# Patient Record
Sex: Male | Born: 1965 | Race: White | Hispanic: No | Marital: Single | Smoking: Never smoker
Health system: Southern US, Community
[De-identification: ages and names within clinical notes are randomized; demographics above are authoritative.]

## PROBLEM LIST (undated history)

## (undated) DIAGNOSIS — D352 Benign neoplasm of pituitary gland: Secondary | ICD-10-CM

## (undated) DIAGNOSIS — I10 Essential (primary) hypertension: Secondary | ICD-10-CM

## (undated) DIAGNOSIS — M109 Gout, unspecified: Secondary | ICD-10-CM

## (undated) HISTORY — DX: Benign neoplasm of pituitary gland: D35.2

## (undated) HISTORY — DX: Gout, unspecified: M10.9

## (undated) HISTORY — DX: Essential (primary) hypertension: I10

---

## 2005-12-21 ENCOUNTER — Ambulatory Visit (HOSPITAL_COMMUNITY): Admission: RE | Admit: 2005-12-21 | Discharge: 2005-12-21 | Payer: Self-pay | Admitting: Unknown Physician Specialty

## 2006-01-13 ENCOUNTER — Other Ambulatory Visit: Payer: Self-pay

## 2006-01-13 ENCOUNTER — Emergency Department: Payer: Self-pay | Admitting: Emergency Medicine

## 2006-06-28 ENCOUNTER — Emergency Department: Payer: Self-pay | Admitting: Unknown Physician Specialty

## 2006-06-28 ENCOUNTER — Other Ambulatory Visit: Payer: Self-pay

## 2007-04-13 IMAGING — CR DG ABDOMEN 3V
1 series · 6 of 6 positions shown · non-contrast
Comparison: none

REASON FOR EXAM: Abdominal pain. [HOSPITAL]
COMMENTS:

[Series 1: view not recorded · 0.17mm/px · 6 of 6 slices shown]
[im 1/6]
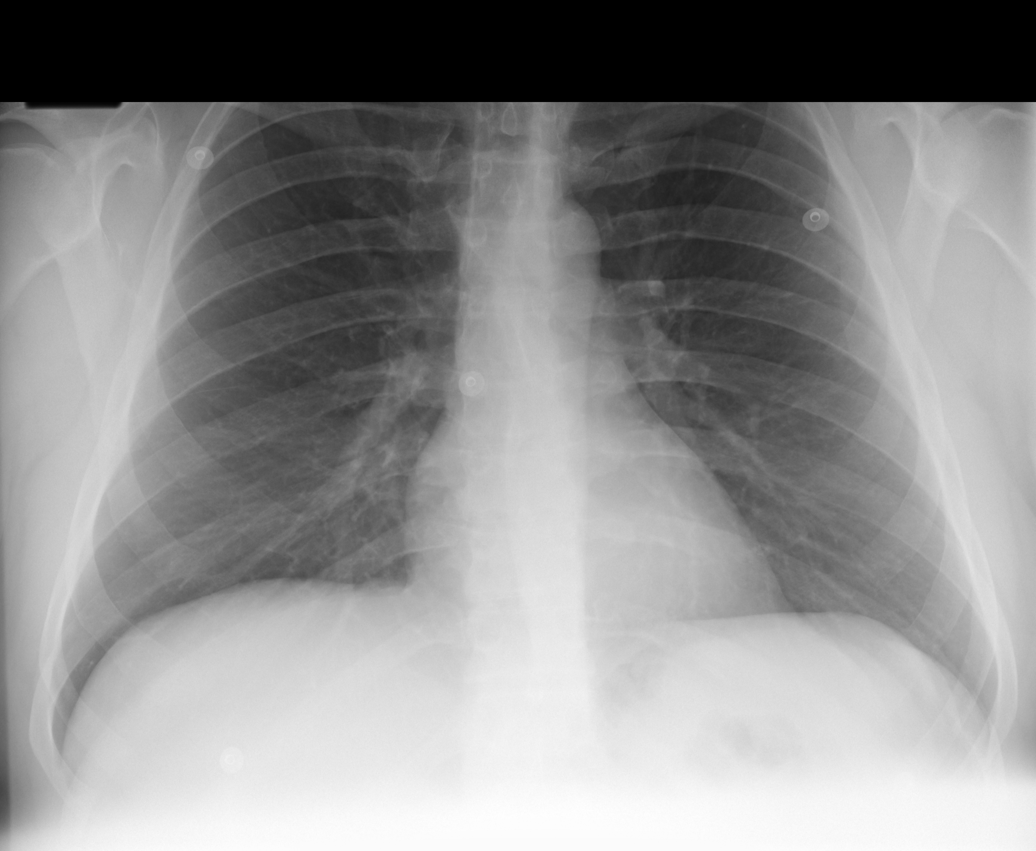
[im 2/6]
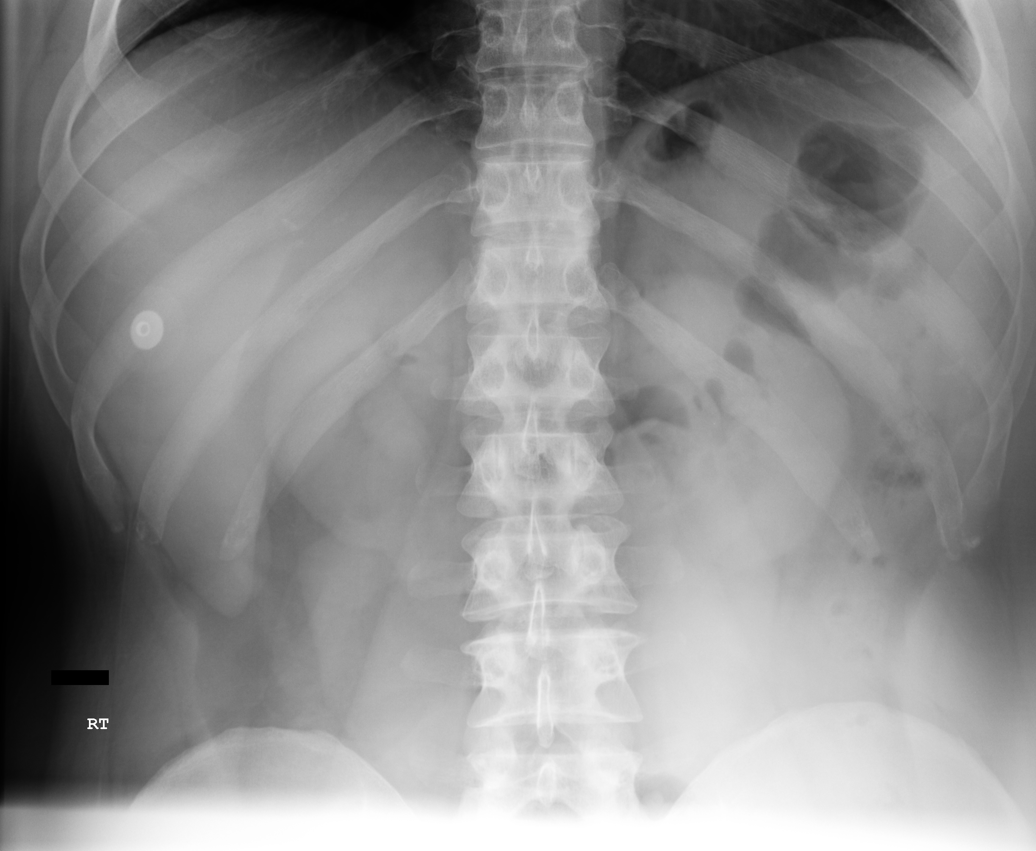
[im 3/6]
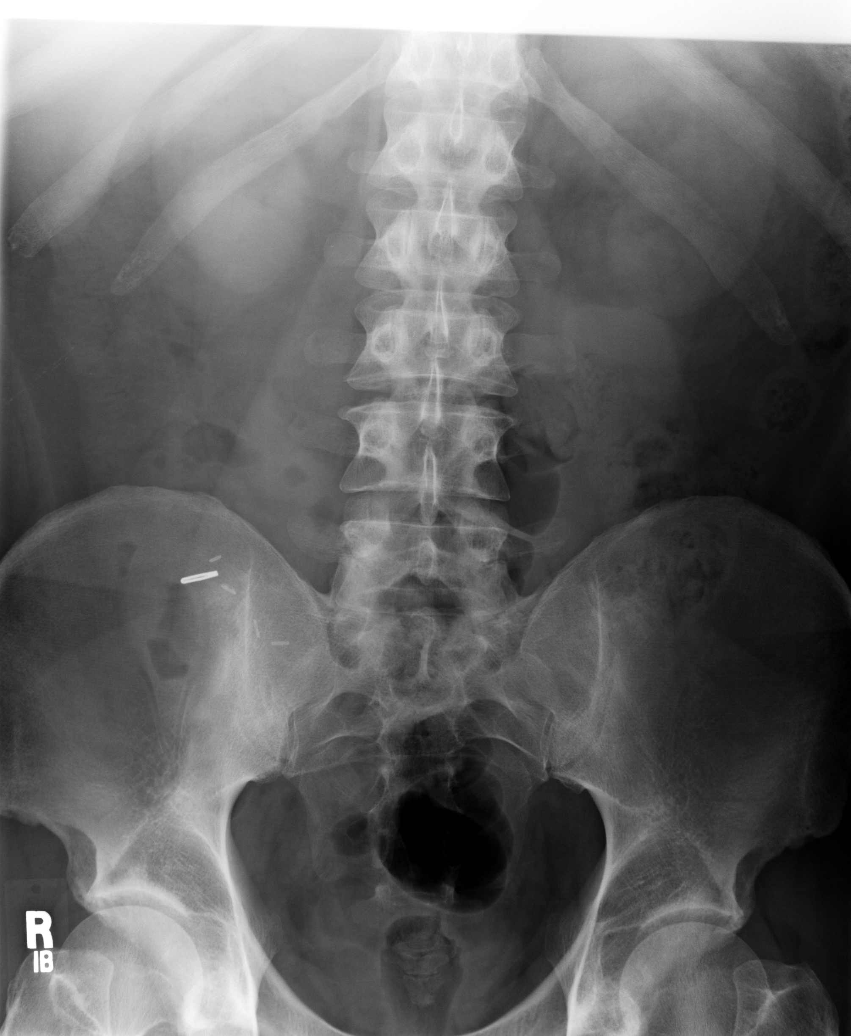
[im 4/6]
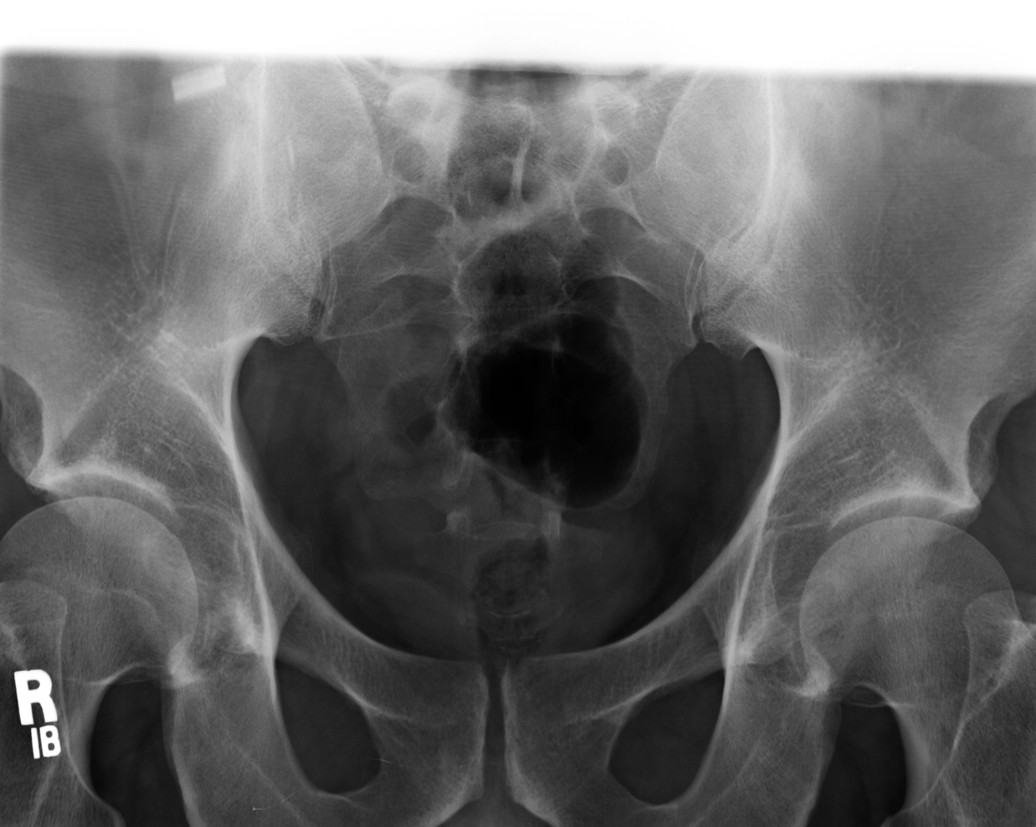
[im 5/6]
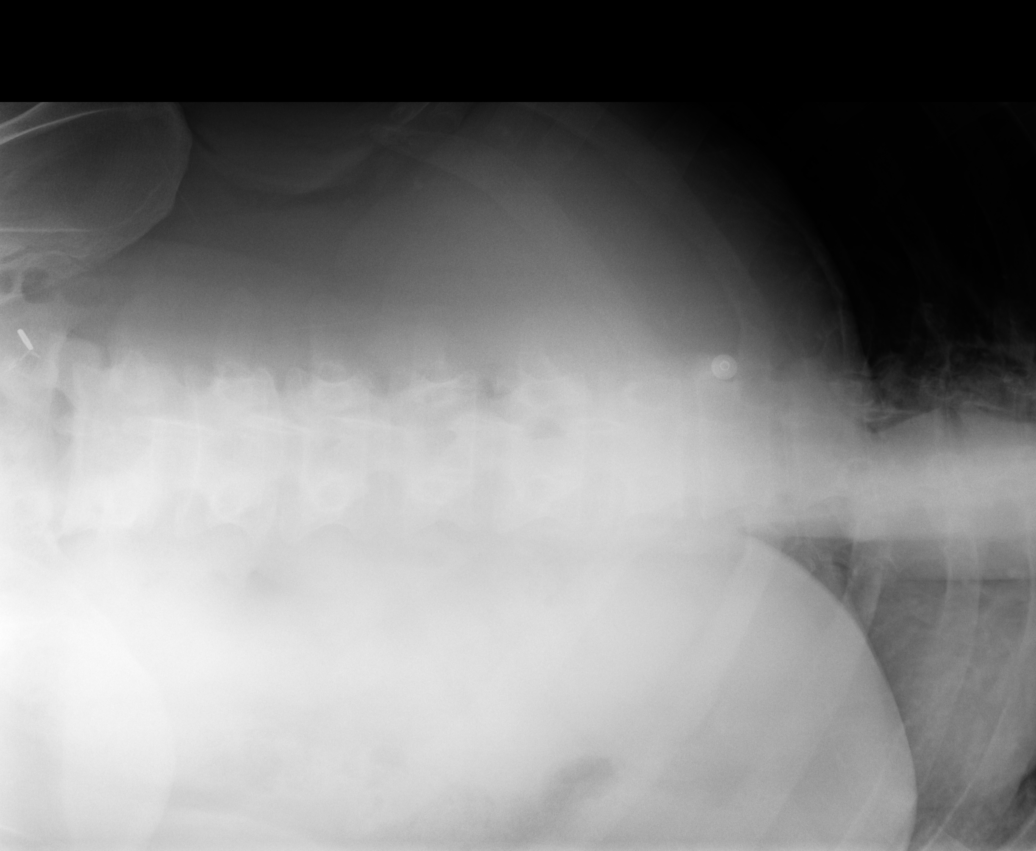
[im 6/6]
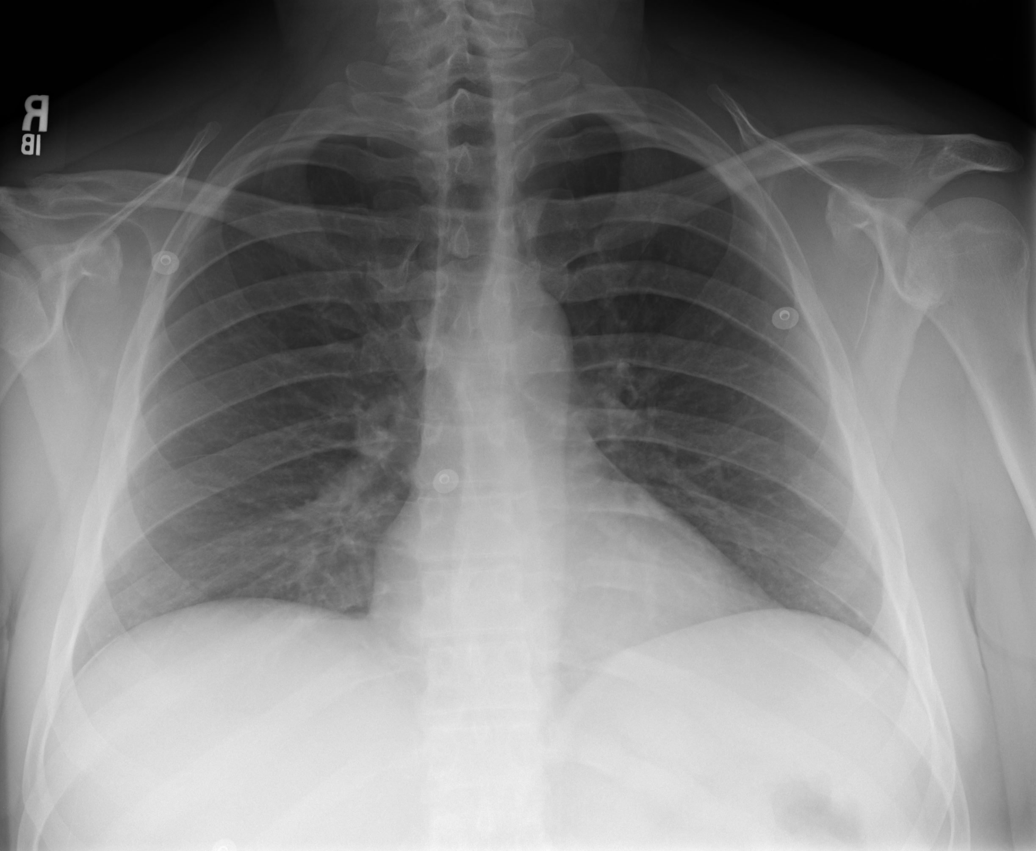

[6 of 6 positions shown; findings below may reference images not displayed]

PROCEDURE:     DXR - DXR ABDOMEN 3-WAY (INCL PA CXR)  - January 13, 2006 [DATE]

RESULT:       PA view of the chest shows the lung fields to be clear.  No
pneumonia, pneumothorax or pleural effusion is seen.  The heart size is
normal.

Five views of the abdomen were obtained.   No subdiaphragmatic free air is
seen.  The bowel gas pattern shows no specific abnormalities.  Postoperative
metallic clips are noted in the RIGHT lower quadrant.
IMPRESSION: No acute changes are identified.

## 2016-08-05 ENCOUNTER — Encounter (HOSPITAL_COMMUNITY): Payer: Self-pay | Admitting: Psychiatry

## 2016-08-05 ENCOUNTER — Ambulatory Visit (INDEPENDENT_AMBULATORY_CARE_PROVIDER_SITE_OTHER): Payer: No Typology Code available for payment source | Admitting: Psychiatry

## 2016-08-05 VITALS — BP 126/74 | HR 81 | Ht 73.0 in | Wt 322.0 lb

## 2016-08-05 DIAGNOSIS — F411 Generalized anxiety disorder: Secondary | ICD-10-CM | POA: Diagnosis not present

## 2016-08-05 DIAGNOSIS — M109 Gout, unspecified: Secondary | ICD-10-CM | POA: Insufficient documentation

## 2016-08-05 DIAGNOSIS — D352 Benign neoplasm of pituitary gland: Secondary | ICD-10-CM | POA: Insufficient documentation

## 2016-08-05 DIAGNOSIS — G4700 Insomnia, unspecified: Secondary | ICD-10-CM | POA: Diagnosis not present

## 2016-08-05 DIAGNOSIS — I10 Essential (primary) hypertension: Secondary | ICD-10-CM

## 2016-08-05 DIAGNOSIS — F3130 Bipolar disorder, current episode depressed, mild or moderate severity, unspecified: Secondary | ICD-10-CM

## 2016-08-05 MED ORDER — LAMOTRIGINE 200 MG PO TABS: 200.0000 mg | ORAL_TABLET | Freq: Two times a day (BID) | ORAL | 0 refills | Status: DC

## 2016-08-05 NOTE — Progress Notes (Signed)
Psychiatric Initial Adult Assessment   Patient Identification: Kevin Savage MRN:  [id number] Date of Evaluation:  08/05/2016 Referral Source: [place] system Chief Complaint:   Chief Complaint    Establish Care     Visit Diagnosis:    ICD-9-CM ICD-10-CM   1. Bipolar I disorder, most recent episode depressed ([place]) 296.50 F31.30   2. GAD (generalized anxiety disorder) 300.02 F41.1   3. Insomnia 780.52 G47.00   4. Pituitary adenoma ([place]) 227.3 D35.2   5. 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   [facility] so [facility]'s referred [facility] currently he does describe to be feeling down emotional disturbed sleep and decreased energy says that his daughter that his stepdaughter died in 19-Apr-2023. He is adjusting to the relocation from [place]. This is his fifth marriage and he is Pituitary Adenoma.  Also endorses excessive worries and worries about his health about relationship's multiple marriages and depressed. He has had manic episodes during which he becomes hypersexual that is that to his breakups in the past and also at times is wife in the past has cheated on him. He has hallucinations auditory when he is manic and toughts of hurting or lashing out feels the voices make him to do that.   He described recurrent episodes of depression including despair and hopelessness has been admitted 2-3 times in the hospital first time in the [facility] in 1989 he tried to hang himself He also has episodes of mania in which she becomes distracted decrease sleep need. Racing thoughts he has bought 3 cars out of his disability. He at one time started buying watches and bought 60 watches and also at times he has bought 30  pairs of shoes. Says manic symptoms he does like it but at that and he does not remember much what has happened or how did he ended up doing these risky behaviors.  Aggravating factor: step daughter died Apr 18, 2016. Multiple marriages . Served in [employer] but was stressful. Pituitary adenoma Modifying factor: current relationship. His disabilities paychecks.   Associated Signs/Symptoms: Depression Symptoms:  anhedonia, difficulty concentrating, anxiety, loss of energy/fatigue, (Hypo) Manic Symptoms:  Distractibility, Labiality of Mood, Anxiety Symptoms:  Excessive Worry, Psychotic Symptoms:  denies currently PTSD Symptoms: NA  Past Psychiatric History: First hospitalization 1989 tried to hang himself while in [employer].  2004. Diagnosed with bipolar with psychotic features admitted in hospital 2014 manic episode.   Previous Psychotropic Medications: Yes  Lithium helped was stopped in [place] not sure why according to him Depakote in past klonopine 1mg  bid in past Substance Abuse History in the last 12 months:  Yes.    Alcohol use 6-8 beers. Stopped 3 weeks ago.    Consequences of Substance Abuse: Medical Consequences:  depression, relationship, impaired judjemenmt Family Consequences:  breakups  Past Medical History:  Past Medical History:  Diagnosis Date  . Gout   . High blood pressure   . Pituitary adenoma ([place])    History reviewed. No pertinent surgical history.  Family Psychiatric History: not aware of   Family History: History reviewed. No pertinent family history.  Social History:   Social History   Social History  .  Marital status: Single    Spouse name: N/A  . Number of children: N/A  . Years of education: N/A   Social History Main Topics  . Smoking status: Never Smoker  . Smokeless tobacco: Never Used  . Alcohol use No  . Drug use: No  . Sexual activity: Yes    Partners: Female   Other Topics Concern  . None   Social History Narrative  . None     Additional Social History: Grew up with his parents. Around his mom and stepdad did not like is still difficulty is ignored him says that he has forgiven him but that is very difficult growing up with him. 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  Married 4 times No kids No current legal issue  Allergies:  No Known Allergies  Metabolic Disorder Labs: No results found for: HGBA1C, MPG No results found for: PROLACTIN No results found for: CHOL, TRIG, HDL, CHOLHDL, VLDL, LDLCALC   Current Medications: Current Outpatient Prescriptions  Medication Sig Dispense Refill  . allopurinol (ZYLOPRIM) 300 MG tablet Take 300 mg by mouth.    Marland Kitchen aspirin 81 MG chewable tablet Chew 81 mg by mouth.    Marland Kitchen buPROPion (WELLBUTRIN XL) 150 MG 24 hr tablet Take 150 mg by mouth.    . cabergoline (DOSTINEX) 0.5 MG tablet Take 0.25 mg by mouth.    . carvedilol (COREG) 6.25 MG tablet Take 6.25 mg by mouth.    . Cyanocobalamin (RA VITAMIN B-12 TR) 1000 MCG TBCR Take by mouth.    . diclofenac (CATAFLAM) 50 MG tablet Take 50 mg by mouth.    . DOCOSAHEXAENOIC ACID PO Take 1 g by mouth.    . fluticasone (FLONASE) 50 MCG/ACT nasal spray Place into the nose.    . furosemide (LASIX) 20 MG tablet Take 20 mg by mouth.    . lamoTRIgine (LAMICTAL) 200 MG tablet Take 1 tablet (200 mg total) by mouth 2 (two) times daily. 60 tablet 0  . loratadine (CLARITIN) 10 MG tablet Take 10 mg by mouth.    Marland Kitchen omeprazole (PRILOSEC) 20 MG capsule Take 20 mg by mouth.    . risperidone (RISPERDAL M-TABS) 3 MG disintegrating tablet Take 3 mg by mouth.    . rosuvastatin (CRESTOR) 20 MG tablet Take 10 mg by mouth.    . sertraline (ZOLOFT) 100 MG tablet Take 200 mg by mouth.    . testosterone enanthate (DELATESTRYL) 200 MG/ML injection Inject into the muscle.    . traZODone (DESYREL) 100 MG tablet Take 100 mg by mouth.    . valsartan (DIOVAN) 160 MG tablet Take 160 mg by mouth.     No current  facility-administered medications for this visit.     Neurologic: Headache: No Seizure: No Paresthesias:No  Musculoskeletal: Strength & Muscle Tone: within normal limits Gait & Station: normal Patient leans: no lean  Psychiatric Specialty Exam: Review of Systems  Cardiovascular: Negative for chest pain.  Skin: Negative for rash.  Psychiatric/Behavioral: Positive for depression. Negative for suicidal ideas.    Blood pressure 126/74, pulse 81, height 6\' 1"  (1.854 m), weight (!) 322 lb (146.1 kg), SpO2 92 %.Body mass index is 42.48 kg/m.  General Appearance: Casual  Eye Contact:  Fair  Speech:  Normal Rate  Volume:  Normal  Mood:  Depressed  Affect:  Congruent and Depressed  Thought Process:  Goal Directed  Orientation:  Full (Time, Place, and Person)  Thought Content:  Rumination  Suicidal Thoughts:  No  Homicidal Thoughts:  No  Memory:  Immediate;   Fair Recent;   Fair  Judgement:  Fair  Insight:  Shallow  Psychomotor Activity:  Normal  Concentration:  Concentration: Fair and Attention Span: Fair  Recall:  [employer] of Knowledge:Fair  Language: Fair  Akathisia:  Negative  Handed:  Right  AIMS (if indicated):    Assets:  Desire for Improvement  ADL's:  Intact  Cognition: WNL  Sleep:  Fair on meds    Treatment Plan Summary: Medication management and Plan as follows  Bipolar 1: depressed: increase lamictal 200mg  bid. Has been taking 100 plus 200 He is also on risperdal M 3 mg qd He is also on zoloft 200mg  , wellbutrin 150mg  Next visit will work on lowering zoloft or wellbutrin, otherwise he feels comfortable with those meds as of now.  GAD: continue zoloft . Has been on klonopine. Avoid starting back . Last use was near 2 years ago Alcohol use: says will not start, referrals reviewed and relapse discussed Insomnia: continue trazadone. Reviewed sleep hygiene.   Goal will be to adjust more mood stabilizers and cut down on anti depressants with pace he wants to  lower it. Feels otherwise may get unstable or more depressed. Pertinent labs reviewed . Follow up with providers for medical conditions.  Consider  psychotherapy for his condition possible learning coping skills More than 50% spent in counseling and coordination of care including Patient education Call 911 or go to emergency room for any urgent concerns of suicidal thoughts Follow up in 3-4 weeks or earlier if needed.   Merian Capron, MD 8/31/20172:45 PM

## 2016-08-06 ENCOUNTER — Ambulatory Visit (HOSPITAL_COMMUNITY): Payer: Self-pay | Admitting: Psychiatry

## 2016-09-02 ENCOUNTER — Ambulatory Visit (INDEPENDENT_AMBULATORY_CARE_PROVIDER_SITE_OTHER): Payer: No Typology Code available for payment source | Admitting: Psychiatry

## 2016-09-02 ENCOUNTER — Telehealth (HOSPITAL_COMMUNITY): Payer: Self-pay | Admitting: *Deleted

## 2016-09-02 ENCOUNTER — Encounter (HOSPITAL_COMMUNITY): Payer: Self-pay | Admitting: Psychiatry

## 2016-09-02 VITALS — BP 124/76 | HR 73 | Ht 73.0 in | Wt 320.0 lb

## 2016-09-02 DIAGNOSIS — G4700 Insomnia, unspecified: Secondary | ICD-10-CM

## 2016-09-02 DIAGNOSIS — F3130 Bipolar disorder, current episode depressed, mild or moderate severity, unspecified: Secondary | ICD-10-CM | POA: Diagnosis not present

## 2016-09-02 DIAGNOSIS — F411 Generalized anxiety disorder: Secondary | ICD-10-CM

## 2016-09-02 MED ORDER — BUPROPION HCL ER (XL) 150 MG PO TB24: 150.0000 mg | ORAL_TABLET | Freq: Every day | ORAL | 0 refills | Status: DC

## 2016-09-02 MED ORDER — LAMOTRIGINE 200 MG PO TABS: 200.0000 mg | ORAL_TABLET | Freq: Two times a day (BID) | ORAL | 0 refills | Status: DC

## 2016-09-02 MED ORDER — TRAZODONE HCL 100 MG PO TABS: 100.0000 mg | ORAL_TABLET | Freq: Every day | ORAL | 0 refills | Status: DC

## 2016-09-02 MED ORDER — RISPERIDONE 3 MG PO TBDP: 3.0000 mg | ORAL_TABLET | Freq: Every day | ORAL | 0 refills | Status: DC

## 2016-09-02 MED ORDER — SERTRALINE HCL 100 MG PO TABS: 200.0000 mg | ORAL_TABLET | Freq: Every day | ORAL | 0 refills | Status: DC

## 2016-09-02 NOTE — Telephone Encounter (Signed)
Pt seen in clinic today. Per Dr. De Nurse, please fax Rx for Zoloft, Lamictal, Trazodone, Wellbutrin, and Risperdal to [place] @ [phone]. Confirmation received @ 1321.

## 2016-09-02 NOTE — Progress Notes (Signed)
[facility] Outpatient Follow up visit   Patient Identification: KINDLE CARVER MRN:  [id number] Date of Evaluation:  09/02/2016 Referral Source: [place] system Chief Complaint:   Chief Complaint    Follow-up     Visit Diagnosis:    ICD-9-CM ICD-10-CM   1. Bipolar I disorder, most recent episode depressed ([place]) 296.50 F31.30   2. GAD (generalized anxiety disorder) 300.02 F41.1   3. 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   Says it is difficult appointment the [place] system so he's referred here  Patient has had recurrent manic episodes in the past and is on multiple medications. Last visit we increased the lamotrigine. Less manic feeling. Still does not want to cut down anti depressants. Says he worries and it may get worse.    Aggravating factor: step daughter died 2016/04/27. Multiple marriages . Served in [employer] but was stressful. Pituitary adenoma Modifying factor: current relationship. His disabilities paychecks.   Associated Signs/Symptoms: Depression Symptoms:  Dysthymic at times. Low energy. (Hypo) Manic Symptoms:  Distractibility, Labiality of Mood, Anxiety Symptoms:  Excessive Worry, Psychotic Symptoms:  denies currently PTSD Symptoms: NA  Past Psychiatric History: First hospitalization 1989 tried to hang himself while in [employer].  2004. Diagnosed with bipolar with psychotic features admitted in hospital 2014 manic episode.   Previous Psychotropic Medications: Yes  Lithium helped was stopped in [place] not sure why according to him Depakote in past klonopine 1mg  bid in past Substance Abuse History in the last 12 months:  Yes.    Alcohol use 6-8 beers. Stopped 7 weeks ago.    Consequences of Substance  Abuse: Medical Consequences:  depression, relationship, impaired judjemenmt Family Consequences:  breakups  Past Medical History:  Past Medical History:  Diagnosis Date  . Gout   . High blood pressure   . Pituitary adenoma ([place])    History reviewed. No pertinent surgical history.  Family Psychiatric History: not aware of   Family History: History reviewed. No pertinent family history.  Social History:   Social History   Social History  . Marital status: Single    Spouse name: N/A  . Number of children: N/A  . Years of education: N/A   Social History Main Topics  . Smoking status: Never Smoker  . Smokeless tobacco: Never Used  . Alcohol use No  . Drug use: No  . Sexual activity: Yes    Partners: Female   Other Topics Concern  . None   Social History Narrative  . None     Allergies:  No Known Allergies  Metabolic Disorder Labs: No results found for: HGBA1C, MPG No results found for: PROLACTIN No results found for: CHOL, TRIG, HDL, CHOLHDL, VLDL, LDLCALC   Current Medications: Current Outpatient Prescriptions  Medication Sig Dispense Refill  . allopurinol (ZYLOPRIM) 300 MG tablet Take 300 mg by mouth.    Marland Kitchen aspirin 81 MG chewable tablet Chew 81 mg by mouth.    Marland Kitchen buPROPion (WELLBUTRIN XL) 150 MG 24 hr tablet Take 1 tablet (150 mg total) by mouth daily. 30 tablet 0  . cabergoline (DOSTINEX) 0.5 MG tablet Take 0.25 mg by mouth.    . carvedilol (COREG) 6.25 MG tablet Take  6.25 mg by mouth.    . Cyanocobalamin (RA VITAMIN B-12 TR) 1000 MCG TBCR Take by mouth.    . diclofenac (CATAFLAM) 50 MG tablet Take 50 mg by mouth.    . DOCOSAHEXAENOIC ACID PO Take 1 g by mouth.    . fluticasone (FLONASE) 50 MCG/ACT nasal spray Place into the nose.    . furosemide (LASIX) 20 MG tablet Take 20 mg by mouth.    . lamoTRIgine (LAMICTAL) 200 MG tablet Take 1 tablet (200 mg total) by mouth 2 (two) times daily. 60 tablet 0  . loratadine (CLARITIN) 10 MG tablet Take 10 mg by mouth.     Marland Kitchen omeprazole (PRILOSEC) 20 MG capsule Take 20 mg by mouth.    . risperidone (RISPERDAL M-TABS) 3 MG disintegrating tablet Take 1 tablet (3 mg total) by mouth daily. 30 tablet 0  . rosuvastatin (CRESTOR) 20 MG tablet Take 10 mg by mouth.    . sertraline (ZOLOFT) 100 MG tablet Take 2 tablets (200 mg total) by mouth daily. 60 tablet 0  . testosterone enanthate (DELATESTRYL) 200 MG/ML injection Inject into the muscle.    . traZODone (DESYREL) 100 MG tablet Take 1 tablet (100 mg total) by mouth at bedtime. 30 tablet 0  . valsartan (DIOVAN) 160 MG tablet Take 160 mg by mouth.     No current facility-administered medications for this visit.     Neurologic: Headache: No Seizure: No Paresthesias:No  Musculoskeletal: Strength & Muscle Tone: within normal limits Gait & Station: normal Patient leans: no lean  Psychiatric Specialty Exam: Review of Systems  Cardiovascular: Negative for palpitations.  Skin: Negative for rash.  Psychiatric/Behavioral: Positive for depression. Negative for suicidal ideas.    Blood pressure 124/76, pulse 73, height 6\' 1"  (1.854 m), weight (!) 320 lb (145.2 kg), SpO2 93 %.Body mass index is 42.22 kg/m.  General Appearance: Casual  Eye Contact:  Fair  Speech:  Normal Rate  Volume:  Normal  Mood:  Depressed  Affect:    Thought Process:  Goal Directed  Orientation:  Full (Time, Place, and Person)  Thought Content:  Rumination  Suicidal Thoughts:  No  Homicidal Thoughts:  No  Memory:  Immediate;   Fair Recent;   Fair  Judgement:  Fair  Insight:  Shallow  Psychomotor Activity:  Normal  Concentration:  Concentration: Fair and Attention Span: Fair  Recall:  [employer] of Knowledge:Fair  Language: Fair  Akathisia:  Negative  Handed:  Right  AIMS (if indicated):    Assets:  Desire for Improvement  ADL's:  Intact  Cognition: WNL  Sleep:  Fair on meds    Treatment Plan Summary: Medication management and Plan as follows  Bipolar 1: depressed: continue   lamictal 200mg  bid and  risperdal M 3 mg qd He is also on zoloft 200mg  , wellbutrin 150mg . Does not want to cut down.   Next visit will work on lowering zoloft or wellbutrin, otherwise he feels comfortable with those meds as of now. [facility]  GAD: continue zoloft . Has been on klonopine. Avoid starting back . Last use was near 2 years ago Alcohol use: says will not start, referrals reviewed and relapse discussed Insomnia: continue trazadone. Reviewed sleep hygiene.   Goal will be to adjust more mood stabilizers and cut down on anti depressants with pace he wants to lower it. But recluctant to change any for now.  Feels otherwise may get unstable or more depressed. Pertinent labs reviewed .  Follow up with providers for medical conditions.  Consider  psychotherapy for his condition possible learning coping skills More than 50% spent in counseling and coordination of care including Patient education Call 911 or go to emergency room for any urgent concerns of suicidal thoughts Follow up in 3-4 weeks or earlier if needed. Time spent: 25 minutes  Merian Capron, MD 9/28/201710:55 AM

## 2016-09-07 ENCOUNTER — Ambulatory Visit (HOSPITAL_COMMUNITY): Payer: Self-pay | Admitting: Licensed Clinical Social Worker

## 2016-10-05 ENCOUNTER — Ambulatory Visit (HOSPITAL_COMMUNITY): Payer: Self-pay | Admitting: Licensed Clinical Social Worker

## 2016-10-20 ENCOUNTER — Ambulatory Visit (HOSPITAL_COMMUNITY): Payer: Self-pay | Admitting: Psychiatry

## 2016-10-22 ENCOUNTER — Encounter (HOSPITAL_COMMUNITY): Payer: Self-pay | Admitting: Psychiatry

## 2016-10-22 ENCOUNTER — Ambulatory Visit (INDEPENDENT_AMBULATORY_CARE_PROVIDER_SITE_OTHER): Payer: No Typology Code available for payment source | Admitting: Psychiatry

## 2016-10-22 DIAGNOSIS — Z79899 Other long term (current) drug therapy: Secondary | ICD-10-CM

## 2016-10-22 DIAGNOSIS — F3130 Bipolar disorder, current episode depressed, mild or moderate severity, unspecified: Secondary | ICD-10-CM | POA: Diagnosis not present

## 2016-10-22 DIAGNOSIS — F411 Generalized anxiety disorder: Secondary | ICD-10-CM

## 2016-10-22 DIAGNOSIS — F5102 Adjustment insomnia: Secondary | ICD-10-CM | POA: Diagnosis not present

## 2016-10-22 MED ORDER — BUPROPION HCL ER (XL) 150 MG PO TB24: 150.0000 mg | ORAL_TABLET | Freq: Every day | ORAL | 1 refills | Status: DC

## 2016-10-22 MED ORDER — LAMOTRIGINE 200 MG PO TABS: 200.0000 mg | ORAL_TABLET | Freq: Two times a day (BID) | ORAL | 1 refills | Status: DC

## 2016-10-22 MED ORDER — SERTRALINE HCL 100 MG PO TABS: 200.0000 mg | ORAL_TABLET | Freq: Every day | ORAL | 1 refills | Status: DC

## 2016-10-22 MED ORDER — TRAZODONE HCL 100 MG PO TABS: 100.0000 mg | ORAL_TABLET | Freq: Every day | ORAL | 1 refills | Status: DC

## 2016-10-22 MED ORDER — RISPERIDONE 3 MG PO TBDP: 3.0000 mg | ORAL_TABLET | Freq: Every day | ORAL | 1 refills | Status: DC

## 2016-10-22 NOTE — Progress Notes (Signed)
[facility] Outpatient Follow up visit   Patient Identification: Kevin Savage MRN:  [id number] Date of Evaluation:  10/22/2016 Referral Source: [place] system Chief Complaint:   Chief Complaint    Follow-up     Visit Diagnosis:    ICD-9-CM ICD-10-CM   1. Bipolar I disorder, most recent episode depressed ([place]) 296.50 F31.30   2. GAD (generalized anxiety disorder) 300.02 F41.1   3. 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   Bipolar disorder continue to have some depressive days when he thinks about his step daughter who died because of cancer becomes tearful but overall is trying to keep himself busy as we asked him to do last time He continues take Lamictal no rash Risperdal no tremors. He does have a very supportive wife and has a daughter says keeps him busy Anxiety; he does worry at times excessive with is trying to work on distraction techniques says that it is difficult Dr. Insomnia; fluctuate depending on the stress level. Takes trazadone.    Aggravating factor: step daughter died 2016-05-17. Multiple marriages . Served in [employer] but was stressful. Pituitary adenoma Modifying factor: current relationship. His disabilities paychecks.   Associated Signs/Symptoms: Depression Symptoms:  Dysthymic at times. Low energy. (Hypo) Manic Symptoms:  Distractibility, Labiality of Mood, Anxiety Symptoms:  Excessive Worry, fluctuates Psychotic Symptoms:  denies currently PTSD Symptoms: NA  Past Psychiatric History: First hospitalization 1989 tried to hang himself while in [employer].  2004. Diagnosed with bipolar with psychotic features admitted in hospital 2014 manic episode.    Substance Abuse History in  the last 12 months:  Yes.    Alcohol use 6-8 beers. Stopped 10 weeks ago.    Consequences of Substance Abuse: Medical Consequences:  depression, relationship, impaired judjemenmt Family Consequences:  breakups  Past Medical History:  Past Medical History:  Diagnosis Date  . Gout   . High blood pressure   . Pituitary adenoma ([place])    History reviewed. No pertinent surgical history.    Family History: History reviewed. No pertinent family history.  Social History:   Social History   Social History  . Marital status: Single    Spouse name: N/A  . Number of children: N/A  . Years of education: N/A   Social History Main Topics  . Smoking status: Never Smoker  . Smokeless tobacco: Never Used  . Alcohol use No  . Drug use: No  . Sexual activity: Yes    Partners: Female   Other Topics Concern  . None   Social History Narrative  . None     Allergies:  No Known Allergies  Metabolic Disorder Labs: No results found for: HGBA1C, MPG No results found for: PROLACTIN No results found for: CHOL, TRIG, HDL, CHOLHDL, VLDL, LDLCALC   Current Medications: Current Outpatient Prescriptions  Medication Sig Dispense Refill  . allopurinol (ZYLOPRIM) 300 MG tablet Take 300 mg by mouth.    Marland Kitchen aspirin 81 MG chewable tablet Chew 81 mg by mouth.    Marland Kitchen buPROPion (WELLBUTRIN XL) 150 MG 24 hr tablet Take 1 tablet (150 mg total) by mouth daily. 30 tablet 1  . cabergoline (DOSTINEX) 0.5 MG tablet Take 0.25 mg by  mouth.    . carvedilol (COREG) 6.25 MG tablet Take 6.25 mg by mouth.    . Cyanocobalamin (RA VITAMIN B-12 TR) 1000 MCG TBCR Take by mouth.    . diclofenac (CATAFLAM) 50 MG tablet Take 50 mg by mouth.    . DOCOSAHEXAENOIC ACID PO Take 1 g by mouth.    . fluticasone (FLONASE) 50 MCG/ACT nasal spray Place into the nose.    . furosemide (LASIX) 20 MG tablet Take 20 mg by mouth.    . lamoTRIgine (LAMICTAL) 200 MG tablet Take 1 tablet (200 mg total) by mouth 2 (two) times daily. 60  tablet 1  . loratadine (CLARITIN) 10 MG tablet Take 10 mg by mouth.    Marland Kitchen omeprazole (PRILOSEC) 20 MG capsule Take 20 mg by mouth.    . risperidone (RISPERDAL M-TABS) 3 MG disintegrating tablet Take 1 tablet (3 mg total) by mouth daily. 30 tablet 1  . rosuvastatin (CRESTOR) 20 MG tablet Take 10 mg by mouth.    . sertraline (ZOLOFT) 100 MG tablet Take 2 tablets (200 mg total) by mouth daily. 60 tablet 1  . testosterone enanthate (DELATESTRYL) 200 MG/ML injection Inject into the muscle.    . traZODone (DESYREL) 100 MG tablet Take 1 tablet (100 mg total) by mouth at bedtime. 30 tablet 1  . valsartan (DIOVAN) 160 MG tablet Take 160 mg by mouth.     No current facility-administered medications for this visit.     Neurologic: Headache: No Seizure: No Paresthesias:No  Musculoskeletal: Strength & Muscle Tone: within normal limits Gait & Station: normal Patient leans: no lean  Psychiatric Specialty Exam: Review of Systems  Cardiovascular: Negative for chest pain.  Skin: Negative for rash.  Psychiatric/Behavioral: Positive for depression. Negative for substance abuse and suicidal ideas.    There were no vitals taken for this visit.There is no height or weight on file to calculate BMI.  General Appearance: Casual  Eye Contact:  Fair  Speech:  Normal Rate  Volume:  Normal  Mood: somewhat depressed  Affect:  sad  Thought Process:  Goal Directed  Orientation:  Full (Time, Place, and Person)  Thought Content:  Rumination  Suicidal Thoughts:  No  Homicidal Thoughts:  No  Memory:  Immediate;   Fair Recent;   Fair  Judgement:  Fair  Insight:  Shallow  Psychomotor Activity:  Normal  Concentration:  Concentration: Fair and Attention Span: Fair  Recall:  [employer] of Knowledge:Fair  Language: Fair  Akathisia:  Negative  Handed:  Right  AIMS (if indicated):    Assets:  Desire for Improvement  ADL's:  Intact  Cognition: WNL  Sleep:  Fair on meds    Treatment Plan  Summary: Medication management and Plan as follows  Bipolar 1: depressed: continue  lamictal 200mg  bid and  risperdal M 3 mg qd He is also on zoloft 200mg  , wellbutrin 150mg . Does not want to cut down.  Depression flucutates depending upon stress level.  Prescriptions printed.  As of now we will keep meds as till spring time and evaluate if something can be cut down.   GAD: continue zoloft . Has been on klonopine. Avoid starting back . Last use was near 2 years ago Alcohol use: says will not start, referrals reviewed and relapse discussed Insomnia: continue trazadone. Reviewed sleep hygiene.   Goal will be to adjust more mood stabilizers and cut down on anti depressants with pace he wants to lower it. But recluctant to change any for now.  Feels otherwise may get unstable or more depressed. Pertinent labs reviewed . Follow up with providers for medical conditions.  Consider  psychotherapy for his condition possible learning coping skills More than 50% spent in counseling and coordination of care including Patient education Call 911 or go to emergency room for any urgent concerns of suicidal thoughts Follow up in 2 months or earlier if needed. Time spent: 25 minutes  De Nurse, Tashianna [place]ome, MD 11/17/20178:50 AM

## 2016-10-27 ENCOUNTER — Ambulatory Visit (HOSPITAL_COMMUNITY): Payer: Self-pay | Admitting: Licensed Clinical Social Worker

## 2016-11-11 ENCOUNTER — Ambulatory Visit (HOSPITAL_COMMUNITY): Payer: Self-pay | Admitting: Licensed Clinical Social Worker

## 2016-12-16 ENCOUNTER — Ambulatory Visit (HOSPITAL_COMMUNITY): Payer: Self-pay | Admitting: Psychiatry

## 2016-12-20 ENCOUNTER — Ambulatory Visit (HOSPITAL_COMMUNITY): Payer: Self-pay | Admitting: Licensed Clinical Social Worker

## 2017-01-06 ENCOUNTER — Ambulatory Visit (HOSPITAL_COMMUNITY): Payer: Self-pay | Admitting: Psychiatry

## 2017-01-11 ENCOUNTER — Ambulatory Visit (INDEPENDENT_AMBULATORY_CARE_PROVIDER_SITE_OTHER): Payer: No Typology Code available for payment source | Admitting: Psychiatry

## 2017-01-11 ENCOUNTER — Telehealth (HOSPITAL_COMMUNITY): Payer: Self-pay | Admitting: Psychiatry

## 2017-01-11 ENCOUNTER — Encounter (HOSPITAL_COMMUNITY): Payer: Self-pay | Admitting: Psychiatry

## 2017-01-11 VITALS — BP 128/70 | HR 81 | Resp 16 | Ht 73.0 in | Wt 340.0 lb

## 2017-01-11 DIAGNOSIS — F3130 Bipolar disorder, current episode depressed, mild or moderate severity, unspecified: Secondary | ICD-10-CM

## 2017-01-11 DIAGNOSIS — Z79899 Other long term (current) drug therapy: Secondary | ICD-10-CM

## 2017-01-11 DIAGNOSIS — F411 Generalized anxiety disorder: Secondary | ICD-10-CM

## 2017-01-11 DIAGNOSIS — F5102 Adjustment insomnia: Secondary | ICD-10-CM

## 2017-01-11 DIAGNOSIS — Z7982 Long term (current) use of aspirin: Secondary | ICD-10-CM

## 2017-01-11 MED ORDER — HYDROXYZINE PAMOATE 25 MG PO CAPS: 25.0000 mg | ORAL_CAPSULE | Freq: Every day | ORAL | 0 refills | Status: DC | PRN

## 2017-01-11 MED ORDER — TRAZODONE HCL 100 MG PO TABS: 100.0000 mg | ORAL_TABLET | Freq: Every day | ORAL | 1 refills | Status: DC

## 2017-01-11 MED ORDER — RISPERIDONE 3 MG PO TBDP: 3.0000 mg | ORAL_TABLET | Freq: Every day | ORAL | 1 refills | Status: DC

## 2017-01-11 MED ORDER — BUPROPION HCL ER (XL) 150 MG PO TB24: 450.0000 mg | ORAL_TABLET | Freq: Every day | ORAL | 1 refills | Status: DC

## 2017-01-11 MED ORDER — LAMOTRIGINE 200 MG PO TABS: 200.0000 mg | ORAL_TABLET | Freq: Two times a day (BID) | ORAL | 1 refills | Status: DC

## 2017-01-11 NOTE — Telephone Encounter (Signed)
Printing vistaril 25mg  for anxiety

## 2017-01-11 NOTE — Telephone Encounter (Signed)
Per pt- he was told to call back to give the name of the rx that dr. De Nurse could write for him  Hydroxyzine tamoate 50 mg. 1 capsule by mouth daily as needed for anxiety.   Send to [place]

## 2017-01-11 NOTE — Telephone Encounter (Signed)
Will reinstate vistaril 25mg  qd prn for anxiety. Has been getting from [place] in the past

## 2017-01-11 NOTE — Progress Notes (Signed)
[facility] Outpatient Follow up visit   Patient Identification: Kevin Savage MRN:  [id number] Date of Evaluation:  01/11/2017 Referral Source: [place] system Chief Complaint:   Chief Complaint    Follow-up     Visit Diagnosis:    ICD-9-CM ICD-10-CM   1. Bipolar I disorder, most recent episode depressed ([place]) 296.50 F31.30   2. GAD (generalized anxiety disorder) 300.02 F41.1   3. 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   Bipolar disorder: has gone more sad since low dose of wellbutrin so he himself increased to 450mg  says it works better On lamictal . No rash Trying to go out and spend time with horses  He does have a very supportive wife and has a daughter says keeps him busy Anxiety; fluctuates but trying distraction techniques.. On zoloft Insomnia: risperdal and trazadone hellps No tremors Planning gastric bypass. Will review with providers. .    Aggravating factor: step daughter died 06/01/2016. Multiple marriages . Served in [employer] but was stressful. Pituitary adenoma Modifying factor: current relationship. His disabilities paychecks.   Associated Signs/Symptoms: No psychosis or paranoia  Past Psychiatric History: First hospitalization 1989 tried to hang himself while in [employer].  2004. Diagnosed with bipolar with psychotic features admitted in hospital 2014 manic episode.      Past Medical History:  Past Medical History:  Diagnosis Date  . Gout   . High blood pressure   . Pituitary adenoma ([place])    History reviewed. No pertinent surgical history.    Family History: History reviewed. No pertinent family history.  Social History:   Social History   Social History  . Marital  status: Single    Spouse name: N/A  . Number of children: N/A  . Years of education: N/A   Social History Main Topics  . Smoking status: Never Smoker  . Smokeless tobacco: Never Used  . Alcohol use No  . Drug use: No  . Sexual activity: Yes    Partners: Female   Other Topics Concern  . None   Social History Narrative  . None     Allergies:  No Known Allergies  Metabolic Disorder Labs: No results found for: HGBA1C, MPG No results found for: PROLACTIN No results found for: CHOL, TRIG, HDL, CHOLHDL, VLDL, LDLCALC   Current Medications: Current Outpatient Prescriptions  Medication Sig Dispense Refill  . allopurinol (ZYLOPRIM) 300 MG tablet Take 300 mg by mouth.    Marland Kitchen aspirin 81 MG chewable tablet Chew 81 mg by mouth.    Marland Kitchen buPROPion (WELLBUTRIN XL) 150 MG 24 hr tablet Take 3 tablets (450 mg total) by mouth daily. 90 tablet 1  . cabergoline (DOSTINEX) 0.5 MG tablet Take 0.25 mg by mouth.    . carvedilol (COREG) 6.25 MG tablet Take 6.25 mg by mouth.    . Cyanocobalamin (RA VITAMIN B-12 TR) 1000 MCG TBCR Take by mouth.    . diclofenac (CATAFLAM) 50 MG tablet Take 50 mg by mouth.    . DOCOSAHEXAENOIC ACID PO Take 1 g by mouth.    . fluticasone (FLONASE) 50 MCG/ACT nasal spray Place into the nose.    . furosemide (LASIX) 20 MG tablet Take 20 mg  by mouth.    . lamoTRIgine (LAMICTAL) 200 MG tablet Take 1 tablet (200 mg total) by mouth 2 (two) times daily. 60 tablet 1  . loratadine (CLARITIN) 10 MG tablet Take 10 mg by mouth.    Marland Kitchen omeprazole (PRILOSEC) 20 MG capsule Take 20 mg by mouth.    . risperidone (RISPERDAL M-TABS) 3 MG disintegrating tablet Take 1 tablet (3 mg total) by mouth daily. 30 tablet 1  . rosuvastatin (CRESTOR) 20 MG tablet Take 10 mg by mouth.    . sertraline (ZOLOFT) 100 MG tablet Take 2 tablets (200 mg total) by mouth daily. 60 tablet 1  . testosterone enanthate (DELATESTRYL) 200 MG/ML injection Inject into the muscle.    . traZODone (DESYREL) 100 MG tablet Take  1 tablet (100 mg total) by mouth at bedtime. 30 tablet 1  . valsartan (DIOVAN) 160 MG tablet Take 160 mg by mouth.     No current facility-administered medications for this visit.       Psychiatric Specialty Exam: Review of Systems  Cardiovascular: Negative for palpitations.  Gastrointestinal: Negative for nausea.  Skin: Negative for rash.  Psychiatric/Behavioral: Positive for depression. Negative for substance abuse and suicidal ideas.    Blood pressure 128/70, pulse 81, resp. rate 16, height 6\' 1"  (1.854 m), weight (!) 340 lb (154.2 kg), SpO2 91 %.Body mass index is 44.86 kg/m.  General Appearance: Casual  Eye Contact:  Fair  Speech:  Normal Rate  Volume:  Normal  Mood: fluctuates to depression   Affect:  constricted  Thought Process:  Goal Directed  Orientation:  Full (Time, Place, and Person)  Thought Content:  Rumination  Suicidal Thoughts:  No  Homicidal Thoughts:  No  Memory:  Immediate;   Fair Recent;   Fair  Judgement:  Fair  Insight:  Shallow  Psychomotor Activity:  Normal  Concentration:  Concentration: Fair and Attention Span: Fair  Recall:  [employer] of Knowledge:Fair  Language: Fair  Akathisia:  Negative  Handed:  Right  AIMS (if indicated):    Assets:  Desire for Improvement  ADL's:  Intact  Cognition: WNL  Sleep:  Fair on meds    Treatment Plan Summary: Medication management and Plan as follows   Bipolar depressed: can continue wellbutrin 450mg  total as he feels down on lower dose lamictal and risperdal [facility] Depression: fluctuates. Continue wellbutrin and zoloft Alcohol use: relapse prevention discussed. Says will not start. GAd: continue zoloft. Not worsened.  Prescriptions printed.  Pertinent labs reviewed . Follow up with providers for medical conditions.  Consider  psychotherapy for his condition possible learning coping skills More than 50% spent in counseling and coordination of care including Patient education Call 911  or go to emergency room for any urgent concerns of suicidal thoughts Follow up in 2-3 months or earlier if needed.   Merian Capron, MD 2/6/201810:03 AM

## 2017-01-11 NOTE — Telephone Encounter (Signed)
Pt seen in office today. Per pt's request, please fax prescriptions to [facility] at [phone]. Confirmation received at [phone]. Nothing further is needed a this time.

## 2017-02-11 ENCOUNTER — Telehealth (HOSPITAL_COMMUNITY): Payer: Self-pay | Admitting: *Deleted

## 2017-02-11 MED ORDER — SERTRALINE HCL 100 MG PO TABS: 200.0000 mg | ORAL_TABLET | Freq: Every day | ORAL | 1 refills | Status: DC

## 2017-02-11 NOTE — Telephone Encounter (Signed)
Pt request a refill for Zoloft/ Rx was last written on 10/2016. Pt will need a print prescription for Zoloft 200mg  to take to [facility].  Pt will pick up prescription today.

## 2017-02-11 NOTE — Telephone Encounter (Signed)
Printed zoloft for pick up

## 2017-03-23 ENCOUNTER — Telehealth (HOSPITAL_COMMUNITY): Payer: Self-pay | Admitting: Psychiatry

## 2017-03-23 ENCOUNTER — Encounter (HOSPITAL_COMMUNITY): Payer: Self-pay | Admitting: Psychiatry

## 2017-03-23 ENCOUNTER — Ambulatory Visit (INDEPENDENT_AMBULATORY_CARE_PROVIDER_SITE_OTHER): Payer: Medicare Other | Admitting: Psychiatry

## 2017-03-23 ENCOUNTER — Telehealth (HOSPITAL_COMMUNITY): Payer: Self-pay | Admitting: *Deleted

## 2017-03-23 VITALS — BP 126/74 | HR 78 | Resp 16 | Ht 73.0 in | Wt 344.0 lb

## 2017-03-23 DIAGNOSIS — Z79899 Other long term (current) drug therapy: Secondary | ICD-10-CM | POA: Diagnosis not present

## 2017-03-23 DIAGNOSIS — F3130 Bipolar disorder, current episode depressed, mild or moderate severity, unspecified: Secondary | ICD-10-CM | POA: Diagnosis not present

## 2017-03-23 DIAGNOSIS — F5102 Adjustment insomnia: Secondary | ICD-10-CM | POA: Diagnosis not present

## 2017-03-23 DIAGNOSIS — Z7982 Long term (current) use of aspirin: Secondary | ICD-10-CM

## 2017-03-23 DIAGNOSIS — F411 Generalized anxiety disorder: Secondary | ICD-10-CM | POA: Diagnosis not present

## 2017-03-23 MED ORDER — HYDROXYZINE PAMOATE 25 MG PO CAPS: 25.0000 mg | ORAL_CAPSULE | Freq: Every day | ORAL | 1 refills | Status: DC | PRN

## 2017-03-23 MED ORDER — BUPROPION HCL ER (XL) 150 MG PO TB24: 450.0000 mg | ORAL_TABLET | Freq: Every day | ORAL | 1 refills | Status: DC

## 2017-03-23 MED ORDER — LAMOTRIGINE 200 MG PO TABS: 200.0000 mg | ORAL_TABLET | Freq: Two times a day (BID) | ORAL | 1 refills | Status: DC

## 2017-03-23 MED ORDER — RISPERIDONE 3 MG PO TBDP: 3.0000 mg | ORAL_TABLET | Freq: Every day | ORAL | 1 refills | Status: DC

## 2017-03-23 MED ORDER — TRAZODONE HCL 100 MG PO TABS: 100.0000 mg | ORAL_TABLET | Freq: Every day | ORAL | 1 refills | Status: DC

## 2017-03-23 MED ORDER — SERTRALINE HCL 100 MG PO TABS: 200.0000 mg | ORAL_TABLET | Freq: Every day | ORAL | 1 refills | Status: DC

## 2017-03-23 NOTE — Telephone Encounter (Signed)
Noted. Please ask to bring bottle of risperdal next visit appointment as well.

## 2017-03-23 NOTE — Progress Notes (Signed)
[facility] Outpatient Follow up visit   Patient Identification: Kevin Savage MRN:  [id number] Date of Evaluation:  03/23/2017 Referral Source: [place] system Chief Complaint:   Chief Complaint    Follow-up     Visit Diagnosis:    ICD-9-CM ICD-10-CM   1. Bipolar I disorder, most recent episode depressed ([place]) 296.50 F31.30   2. GAD (generalized anxiety disorder) 300.02 F41.1   3. 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   Remains at baseline with his current medication of Risperdal 3 mg. He is also on Wellbutrin. Tolerating medication does endorse some sad days but tries to focus on the positive E has a good supportive wife but he is worried about his physical health and also some days are sad and it is difficult for him to get out of it  till the next day  He is planning gastric bypass.  Trazodone helps him sleep. Vistaril is for the anxiety and he takes it when necessary   Aggravating factor: step daughter died 2016/05/25. Military service was stressuful Pituitary adenoma Modifying factor: current relationship.his paychecks and service connection  Associated Signs/Symptoms: No psychosis or paranoia  Past Psychiatric History: First hospitalization 1989 tried to hang himself while in [employer].  2004. Diagnosed with bipolar with psychotic features admitted in hospital 2014 manic episode.      Past Medical History:  Past Medical History:  Diagnosis Date  . Gout   . High blood pressure   . Pituitary adenoma ([place])    History reviewed. No pertinent surgical history.    Family History: History reviewed. No pertinent family history.  Social History:   Social History   Social History  . Marital  status: Single    Spouse name: N/A  . Number of children: N/A  . Years of education: N/A   Social History Main Topics  . Smoking status: Never Smoker  . Smokeless tobacco: Never Used  . Alcohol use No  . Drug use: No  . Sexual activity: Yes    Partners: Female   Other Topics Concern  . None   Social History Narrative  . None     Allergies:  No Known Allergies  Metabolic Disorder Labs: No results found for: HGBA1C, MPG No results found for: PROLACTIN No results found for: CHOL, TRIG, HDL, CHOLHDL, VLDL, LDLCALC   Current Medications: Current Outpatient Prescriptions  Medication Sig Dispense Refill  . allopurinol (ZYLOPRIM) 300 MG tablet Take 300 mg by mouth.    Marland Kitchen aspirin 81 MG chewable tablet Chew 81 mg by mouth.    Marland Kitchen buPROPion (WELLBUTRIN XL) 150 MG 24 hr tablet Take 3 tablets (450 mg total) by mouth daily. 90 tablet 1  . cabergoline (DOSTINEX) 0.5 MG tablet Take 0.25 mg by mouth.    . carvedilol (COREG) 6.25 MG tablet Take 6.25 mg by mouth.    . Cyanocobalamin (RA VITAMIN B-12 TR) 1000 MCG TBCR Take by mouth.    . diclofenac (CATAFLAM) 50 MG tablet Take 50 mg by mouth.    . DOCOSAHEXAENOIC ACID PO Take 1 g by mouth.    . fluticasone (FLONASE) 50 MCG/ACT nasal spray Place into the nose.    Marland Kitchen  furosemide (LASIX) 20 MG tablet Take 20 mg by mouth.    . hydrOXYzine (VISTARIL) 25 MG capsule Take 1 capsule (25 mg total) by mouth daily as needed for itching. 30 capsule 1  . lamoTRIgine (LAMICTAL) 200 MG tablet Take 1 tablet (200 mg total) by mouth 2 (two) times daily. 60 tablet 1  . loratadine (CLARITIN) 10 MG tablet Take 10 mg by mouth.    Marland Kitchen omeprazole (PRILOSEC) 20 MG capsule Take 20 mg by mouth.    . risperidone (RISPERDAL M-TABS) 3 MG disintegrating tablet Take 1 tablet (3 mg total) by mouth daily. 30 tablet 1  . rosuvastatin (CRESTOR) 20 MG tablet Take 10 mg by mouth.    . sertraline (ZOLOFT) 100 MG tablet Take 2 tablets (200 mg total) by mouth daily. 60 tablet 1  .  testosterone enanthate (DELATESTRYL) 200 MG/ML injection Inject into the muscle.    . traZODone (DESYREL) 100 MG tablet Take 1 tablet (100 mg total) by mouth at bedtime. 30 tablet 1  . valsartan (DIOVAN) 160 MG tablet Take 160 mg by mouth.     No current facility-administered medications for this visit.       Psychiatric Specialty Exam: Review of Systems  Cardiovascular: Negative for chest pain.  Gastrointestinal: Negative for nausea.  Skin: Negative for rash.  Neurological: Negative for tremors.  Psychiatric/Behavioral: Negative for substance abuse and suicidal ideas.    Blood pressure 126/74, pulse 78, resp. rate 16, height 6\' 1"  (1.854 m), weight (!) 344 lb (156 kg), SpO2 91 %.Body mass index is 45.39 kg/m.  General Appearance: Casual  Eye Contact:  Fair  Speech:  Normal Rate  Volume:  Normal  Mood: sad but not hopeless  Affect:  Congruent somewhat reactive  Thought Process:  Goal Directed  Orientation:  Full (Time, Place, and Person)  Thought Content:  Rumination  Suicidal Thoughts:  No  Homicidal Thoughts:  No  Memory:  Immediate;   Fair Recent;   Fair  Judgement:  Fair  Insight:  Shallow  Psychomotor Activity:  Normal  Concentration:  Concentration: Fair and Attention Span: Fair  Recall:  [employer] of Knowledge:Fair  Language: Fair  Akathisia:  Negative  Handed:  Right  AIMS (if indicated):    Assets:  Desire for Improvement  ADL's:  Intact  Cognition: WNL  Sleep:  Fair on meds    Treatment Plan Summary: Medication management and Plan as follows   Bipolar disorder remains somewhat down but not hopeless continuing on medication using Wellbutrin. He is also on Lamictal and Risperdal we will fax it to the pharmacy at the [facility]  I'll call use relapse mention discussed some remains sober GAD; continue Zoloft at fluctuates depending on the stress level Insomnia review sleep hygiene  Followed with primary care physician regarding physical health prior to  supportive therapy continued work on positivity and increase physical activities during the day To consider possible next visit if can cut down risperdal to 2mg . Also make sure if its sublingual tablet he gets to not swallow it Follow-up in 3 months or earlier if needed    Merian Capron, MD 4/18/201810:18 AM

## 2017-03-23 NOTE — Telephone Encounter (Signed)
Pt seen in office today. Per pt's request, please fax prescriptions for Vistaril, Wellbutrin, Trazodone, Zoloft, Risperidone and Lamictal to [facility] at [phone]. Confirmation received at 11:45. Nothing further is needed a this time.

## 2017-03-23 NOTE — Telephone Encounter (Signed)
Dr. De Nurse was asking about rx pt is taking.  Risperdone rx.  The rx says take 1 at bedtime. The rx does not say anything about dissolve.  Also he take indomethacin 20mg  capsules 2 by mouth 3x a day when flare up.

## 2017-05-13 ENCOUNTER — Telehealth (HOSPITAL_COMMUNITY): Payer: Self-pay | Admitting: Psychiatry

## 2017-05-13 NOTE — Telephone Encounter (Signed)
Pt needs refills on all of his meds sent to [facility] in [place].

## 2017-05-16 MED ORDER — BUPROPION HCL ER (XL) 150 MG PO TB24: 450.0000 mg | ORAL_TABLET | Freq: Every day | ORAL | 1 refills | Status: DC

## 2017-05-16 MED ORDER — LAMOTRIGINE 200 MG PO TABS: 200.0000 mg | ORAL_TABLET | Freq: Two times a day (BID) | ORAL | 1 refills | Status: DC

## 2017-05-16 MED ORDER — SERTRALINE HCL 100 MG PO TABS: 200.0000 mg | ORAL_TABLET | Freq: Every day | ORAL | 1 refills | Status: DC

## 2017-05-16 MED ORDER — SERTRALINE HCL 100 MG PO TABS
200.0000 mg | ORAL_TABLET | Freq: Every day | ORAL | 1 refills | Status: DC
Start: 2017-05-16 — End: 2017-08-23

## 2017-05-16 MED ORDER — HYDROXYZINE PAMOATE 25 MG PO CAPS: 25.0000 mg | ORAL_CAPSULE | Freq: Every day | ORAL | 1 refills | Status: DC | PRN

## 2017-05-16 MED ORDER — TRAZODONE HCL 100 MG PO TABS: 100.0000 mg | ORAL_TABLET | Freq: Every day | ORAL | 1 refills | Status: DC

## 2017-05-16 MED ORDER — RISPERIDONE 3 MG PO TBDP
3.0000 mg | ORAL_TABLET | Freq: Every day | ORAL | 1 refills | Status: DC
Start: 2017-05-16 — End: 2017-08-23

## 2017-05-16 NOTE — Telephone Encounter (Signed)
Patient called office requesting refills. Per Dr. De Nurse, refills are authorize and  Faxed to [facility] for Vistaril, Wellbutrin, Trazodone, Zoloft, Risperidone and Lamictal  at [phone]. Confirmation received at 11:15. Nothing further is needed a this time.

## 2017-05-16 NOTE — Telephone Encounter (Signed)
Printing prescriptions to send to asked pharamcy at [facility]

## 2017-06-15 ENCOUNTER — Ambulatory Visit (HOSPITAL_COMMUNITY): Payer: Self-pay | Admitting: Psychiatry

## 2017-06-20 ENCOUNTER — Ambulatory Visit (INDEPENDENT_AMBULATORY_CARE_PROVIDER_SITE_OTHER): Payer: No Typology Code available for payment source | Admitting: Psychiatry

## 2017-06-20 ENCOUNTER — Encounter (HOSPITAL_COMMUNITY): Payer: Self-pay | Admitting: Psychiatry

## 2017-06-20 DIAGNOSIS — F5102 Adjustment insomnia: Secondary | ICD-10-CM | POA: Diagnosis not present

## 2017-06-20 DIAGNOSIS — F411 Generalized anxiety disorder: Secondary | ICD-10-CM

## 2017-06-20 DIAGNOSIS — F3130 Bipolar disorder, current episode depressed, mild or moderate severity, unspecified: Secondary | ICD-10-CM | POA: Diagnosis not present

## 2017-06-20 DIAGNOSIS — F1099 Alcohol use, unspecified with unspecified alcohol-induced disorder: Secondary | ICD-10-CM

## 2017-06-20 MED ORDER — HYDROXYZINE PAMOATE 25 MG PO CAPS: 25.0000 mg | ORAL_CAPSULE | Freq: Every day | ORAL | 1 refills | Status: DC | PRN

## 2017-06-20 NOTE — Progress Notes (Signed)
[facility] Outpatient Follow up visit   Patient Identification: Kevin Savage MRN:  [id number] Date of Evaluation:  06/20/2017 Referral Source: [place] system Chief Complaint:   Chief Complaint    Follow-up     Visit Diagnosis:    ICD-10-CM   1. Bipolar I disorder, most recent episode depressed ([place]) F31.30   2. GAD (generalized anxiety disorder) F41.1   3. 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   Remains at baseline he is trying to keep positive not get worried about his things that are not his control. He tries to keep himself active and that has helped him he is taking medication no reported side effects. Supportive wife  He made planning gastric bypass lived on trazodone helps him sleep Vistaril for anxiety he's also on Risperdal patient mood appears to be paranoid than last time. Wellbutrin helps the depression.   Aggravating factor: step daughter died 2016/03/31. Military service was stressuful Pituitary adenoma Modifying factor: va disability, supportive wife Associated Signs/Symptoms: No psychosis or paranoia  Past Psychiatric History: First hospitalization 1989 tried to hang himself while in [employer].  Apr 01, 2003. Diagnosed with bipolar with psychotic features admitted in hospital 03/31/13 manic episode.      Past Medical History:  Past Medical History:  Diagnosis Date  . Gout   . High blood pressure   . Pituitary adenoma ([place])    History reviewed. No pertinent surgical history.    Family History: History reviewed. No pertinent family history.  Social History:   Social History   Social History  . Marital status: Single    Spouse name: N/A  . Number of children: N/A  . Years of education: N/A    Social History Main Topics  . Smoking status: Never Smoker  . Smokeless tobacco: Never Used  . Alcohol use No  . Drug use: No  . Sexual activity: Yes    Partners: Female   Other Topics Concern  . None   Social History Narrative  . None     Allergies:  No Known Allergies  Metabolic Disorder Labs: No results found for: HGBA1C, MPG No results found for: PROLACTIN No results found for: CHOL, TRIG, HDL, CHOLHDL, VLDL, LDLCALC   Current Medications: Current Outpatient Prescriptions  Medication Sig Dispense Refill  . allopurinol (ZYLOPRIM) 300 MG tablet Take 300 mg by mouth.    Marland Kitchen aspirin 81 MG chewable tablet Chew 81 mg by mouth.    Marland Kitchen buPROPion (WELLBUTRIN XL) 150 MG 24 hr tablet Take 3 tablets (450 mg total) by mouth daily. 90 tablet 1  . cabergoline (DOSTINEX) 0.5 MG tablet Take 0.25 mg by mouth.    . carvedilol (COREG) 6.25 MG tablet Take 6.25 mg by mouth.    . Cyanocobalamin (RA VITAMIN B-12 TR) 1000 MCG TBCR Take by mouth.    . diclofenac (CATAFLAM) 50 MG tablet Take 50 mg by mouth.    . DOCOSAHEXAENOIC ACID PO Take 1 g by mouth.    . fluticasone (FLONASE) 50 MCG/ACT nasal spray Place into the nose.    . furosemide (LASIX) 20 MG tablet Take 20 mg by mouth.    . hydrOXYzine (VISTARIL) 25 MG capsule Take 1 capsule (25  mg total) by mouth daily as needed for itching. 30 capsule 1  . lamoTRIgine (LAMICTAL) 200 MG tablet Take 1 tablet (200 mg total) by mouth 2 (two) times daily. 60 tablet 1  . loratadine (CLARITIN) 10 MG tablet Take 10 mg by mouth.    Marland Kitchen omeprazole (PRILOSEC) 20 MG capsule Take 20 mg by mouth.    . risperidone (RISPERDAL M-TABS) 3 MG disintegrating tablet Take 1 tablet (3 mg total) by mouth daily. 30 tablet 1  . rosuvastatin (CRESTOR) 20 MG tablet Take 10 mg by mouth.    . sertraline (ZOLOFT) 100 MG tablet Take 2 tablets (200 mg total) by mouth daily. 60 tablet 1  . testosterone enanthate (DELATESTRYL) 200 MG/ML injection Inject into the muscle.    . traZODone  (DESYREL) 100 MG tablet Take 1 tablet (100 mg total) by mouth at bedtime. 30 tablet 1  . valsartan (DIOVAN) 160 MG tablet Take 160 mg by mouth.     No current facility-administered medications for this visit.       Psychiatric Specialty Exam: Review of Systems  Cardiovascular: Negative for palpitations.  Gastrointestinal: Negative for nausea.  Skin: Negative for rash.  Neurological: Negative for tremors.  Psychiatric/Behavioral: Negative for substance abuse and suicidal ideas.    There were no vitals taken for this visit.There is no height or weight on file to calculate BMI.  General Appearance: Casual  Eye Contact:  Fair  Speech:  Normal Rate  Volume:  Normal  Mood: less dysphoric  Affect:  Smiled today. pleasant  Thought Process:  Goal Directed  Orientation:  Full (Time, Place, and Person)  Thought Content:  Rumination  Suicidal Thoughts:  No  Homicidal Thoughts:  No  Memory:  Immediate;   Fair Recent;   Fair  Judgement:  Fair  Insight:  Shallow  Psychomotor Activity:  Normal  Concentration:  Concentration: Fair and Attention Span: Fair  Recall:  [employer] of Knowledge:Fair  Language: Fair  Akathisia:  Negative  Handed:  Right  AIMS (if indicated):    Assets:  Desire for Improvement  ADL's:  Intact  Cognition: WNL  Sleep:  Fair on meds    Treatment Plan Summary: Medication management and Plan as follows   Bipolar disorder remains somewhat down but better then 6 months ago. Has refills can call for his refills  Alcohol use: infrequent now GAD; fluctutates, continue zoloft. Vistaril for prn anxiety. Will fax vistaril running low  Insomnia: not worse. Continue trazadone  questions were addressed continue to distract from worries and keep positive about his blessings and increased activity level. Follow up in 3 months    Merian Capron, MD 7/16/201811:05 AM

## 2017-06-21 ENCOUNTER — Telehealth (HOSPITAL_COMMUNITY): Payer: Self-pay | Admitting: *Deleted

## 2017-06-21 NOTE — Telephone Encounter (Signed)
Pt seen in office on 06/20/17. Per pt's request, please fax prescriptions for Vistaril to [facility] at [phone]. Confirmation received at [phone]. Nothing further is needed a this time.

## 2017-08-22 ENCOUNTER — Telehealth (HOSPITAL_COMMUNITY): Payer: Self-pay | Admitting: Psychiatry

## 2017-08-22 NOTE — Telephone Encounter (Signed)
Pt needs refill no all RX by dr De Nurse.   Please send to [facility]

## 2017-08-22 NOTE — Telephone Encounter (Signed)
Pt called back. He states he will pick up the rx and take them to the [facility] [place] since he is completely out of one.  Can we just print all his rx for refill, call pt when complete. and he will pick up.   wellbutrin Vistaril lamictal risperdal zoloft desyrel

## 2017-08-23 ENCOUNTER — Other Ambulatory Visit (HOSPITAL_COMMUNITY): Payer: Self-pay | Admitting: Psychiatry

## 2017-08-23 MED ORDER — LAMOTRIGINE 200 MG PO TABS: 200.0000 mg | ORAL_TABLET | Freq: Two times a day (BID) | ORAL | 1 refills | Status: DC

## 2017-08-23 MED ORDER — SERTRALINE HCL 100 MG PO TABS: 200.0000 mg | ORAL_TABLET | Freq: Every day | ORAL | 1 refills | Status: DC

## 2017-08-23 MED ORDER — HYDROXYZINE PAMOATE 25 MG PO CAPS: 25.0000 mg | ORAL_CAPSULE | Freq: Every day | ORAL | 1 refills | Status: DC | PRN

## 2017-08-23 MED ORDER — TRAZODONE HCL 100 MG PO TABS: 100.0000 mg | ORAL_TABLET | Freq: Every day | ORAL | 1 refills | Status: DC

## 2017-08-23 MED ORDER — RISPERIDONE 3 MG PO TBDP: 3.0000 mg | ORAL_TABLET | Freq: Every day | ORAL | 1 refills | Status: DC

## 2017-08-23 MED ORDER — BUPROPION HCL ER (XL) 150 MG PO TB24: 450.0000 mg | ORAL_TABLET | Freq: Every day | ORAL | 1 refills | Status: DC

## 2017-08-23 NOTE — Telephone Encounter (Signed)
Lm for patient informing him that all rx is ready to be picked up at the front desk. Nothing further needed at this time.

## 2017-08-23 NOTE — Telephone Encounter (Signed)
printed

## 2017-09-13 ENCOUNTER — Ambulatory Visit (HOSPITAL_COMMUNITY): Payer: Self-pay | Admitting: Psychiatry

## 2017-09-20 ENCOUNTER — Ambulatory Visit (HOSPITAL_COMMUNITY): Payer: Self-pay | Admitting: Psychiatry

## 2017-09-22 ENCOUNTER — Encounter (HOSPITAL_COMMUNITY): Payer: Self-pay | Admitting: Psychiatry

## 2017-09-22 ENCOUNTER — Ambulatory Visit (INDEPENDENT_AMBULATORY_CARE_PROVIDER_SITE_OTHER): Payer: Medicare Other | Admitting: Psychiatry

## 2017-09-22 DIAGNOSIS — F411 Generalized anxiety disorder: Secondary | ICD-10-CM | POA: Diagnosis not present

## 2017-09-22 DIAGNOSIS — Z736 Limitation of activities due to disability: Secondary | ICD-10-CM | POA: Diagnosis not present

## 2017-09-22 DIAGNOSIS — F5102 Adjustment insomnia: Secondary | ICD-10-CM | POA: Diagnosis not present

## 2017-09-22 DIAGNOSIS — F4310 Post-traumatic stress disorder, unspecified: Secondary | ICD-10-CM | POA: Diagnosis not present

## 2017-09-22 DIAGNOSIS — F3130 Bipolar disorder, current episode depressed, mild or moderate severity, unspecified: Secondary | ICD-10-CM

## 2017-09-22 DIAGNOSIS — F1010 Alcohol abuse, uncomplicated: Secondary | ICD-10-CM

## 2017-09-22 MED ORDER — SERTRALINE HCL 100 MG PO TABS: 250.0000 mg | ORAL_TABLET | Freq: Every day | ORAL | 1 refills | Status: DC

## 2017-09-22 MED ORDER — HYDROXYZINE PAMOATE 25 MG PO CAPS: 25.0000 mg | ORAL_CAPSULE | Freq: Every day | ORAL | 1 refills | Status: DC | PRN

## 2017-09-22 MED ORDER — LAMOTRIGINE 200 MG PO TABS: 200.0000 mg | ORAL_TABLET | Freq: Two times a day (BID) | ORAL | 1 refills | Status: DC

## 2017-09-22 MED ORDER — TRAZODONE HCL 100 MG PO TABS: 100.0000 mg | ORAL_TABLET | Freq: Every day | ORAL | 1 refills | Status: DC

## 2017-09-22 MED ORDER — RISPERIDONE 3 MG PO TABS: 3.0000 mg | ORAL_TABLET | Freq: Every day | ORAL | 1 refills | Status: DC

## 2017-09-22 MED ORDER — BUPROPION HCL ER (XL) 150 MG PO TB24: 450.0000 mg | ORAL_TABLET | Freq: Every day | ORAL | 1 refills | Status: DC

## 2017-09-22 NOTE — Progress Notes (Signed)
[facility] Outpatient Follow up visit   Patient Identification: Kevin Savage MRN:  [id number] Date of Evaluation:  09/22/2017 Referral Source: [place] system Chief Complaint:   Chief Complaint    Follow-up     Visit Diagnosis:    ICD-10-CM   1. Bipolar I disorder, most recent episode depressed ([place]) F31.30   2. GAD (generalized anxiety disorder) F41.1   3. 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   Feeling more down and depressed feeling guilty although he has reasonable finances supportive wife. Tolerating medication but gets into the negative thinking    Aggravating factor:step daughter died Mar 21, 2016. Stressful military service Pituitary adenoma Modifying factor: va disability,  Wife  Associated Signs/Symptoms: No psychosis or paranoia  Past Psychiatric History: First hospitalization 1989 tried to hang himself while in [employer].  03/22/03. Diagnosed with bipolar with psychotic features admitted in hospital 2013/03/21 manic episode.      Past Medical History:  Past Medical History:  Diagnosis Date  . Gout   . High blood pressure   . Pituitary adenoma ([place])    History reviewed. No pertinent surgical history.    Family History: History reviewed. No pertinent family history.  Social History:   Social History   Social History  . Marital status: Single    Spouse name: N/A  . Number of children: N/A  . Years of education: N/A   Social History Main Topics  . Smoking status: Never Smoker  . Smokeless tobacco: Never Used  . Alcohol use No  . Drug use: No  . Sexual activity: Yes    Partners: Female   Other Topics Concern  . None   Social History Narrative  . None     Allergies:  No Known  Allergies  Metabolic Disorder Labs: No results found for: HGBA1C, MPG No results found for: PROLACTIN No results found for: CHOL, TRIG, HDL, CHOLHDL, VLDL, LDLCALC   Current Medications: Current Outpatient Prescriptions  Medication Sig Dispense Refill  . allopurinol (ZYLOPRIM) 300 MG tablet Take 300 mg by mouth.    Marland Kitchen aspirin 81 MG chewable tablet Chew 81 mg by mouth.    Marland Kitchen buPROPion (WELLBUTRIN XL) 150 MG 24 hr tablet Take 3 tablets (450 mg total) by mouth daily. 90 tablet 1  . cabergoline (DOSTINEX) 0.5 MG tablet Take 0.25 mg by mouth.    . carvedilol (COREG) 6.25 MG tablet Take 6.25 mg by mouth.    . Cyanocobalamin (RA VITAMIN B-12 TR) 1000 MCG TBCR Take by mouth.    . diclofenac (CATAFLAM) 50 MG tablet Take 50 mg by mouth.    . DOCOSAHEXAENOIC ACID PO Take 1 g by mouth.    . fluticasone (FLONASE) 50 MCG/ACT nasal spray Place into the nose.    . furosemide (LASIX) 20 MG tablet Take 20 mg by mouth.    . hydrOXYzine (VISTARIL) 25 MG capsule Take 1 capsule (25 mg total) by mouth daily as needed for itching. 30 capsule 1  . lamoTRIgine (LAMICTAL) 200 MG tablet Take 1 tablet (200 mg total) by mouth 2 (two) times daily. 60 tablet 1  . loratadine (CLARITIN) 10 MG tablet Take 10 mg by mouth.    Marland Kitchen omeprazole (  PRILOSEC) 20 MG capsule Take 20 mg by mouth.    . risperiDONE (RISPERDAL) 3 MG tablet Take 1 tablet (3 mg total) by mouth daily. 30 tablet 1  . rosuvastatin (CRESTOR) 20 MG tablet Take 10 mg by mouth.    . sertraline (ZOLOFT) 100 MG tablet Take 2.5 tablets (250 mg total) by mouth daily. 75 tablet 1  . testosterone enanthate (DELATESTRYL) 200 MG/ML injection Inject into the muscle.    . traZODone (DESYREL) 100 MG tablet Take 1 tablet (100 mg total) by mouth at bedtime. 30 tablet 1  . valsartan (DIOVAN) 160 MG tablet Take 160 mg by mouth.     No current facility-administered medications for this visit.       Psychiatric Specialty Exam: Review of Systems  Cardiovascular: Negative for  palpitations.  Gastrointestinal: Negative for nausea.  Skin: Negative for rash.  Neurological: Negative for tremors.  Psychiatric/Behavioral: Positive for depression. Negative for substance abuse and suicidal ideas.    There were no vitals taken for this visit.There is no height or weight on file to calculate BMI.  General Appearance: Casual  Eye Contact:  Fair  Speech:  Normal Rate  Volume:  Normal  Mood: dysphoric  Affect:  depressed  Thought Process:  Goal Directed  Orientation:  Full (Time, Place, and Person)  Thought Content:  Rumination  Suicidal Thoughts:  No  Homicidal Thoughts:  No  Memory:  Immediate;   Fair Recent;   Fair  Judgement:  Fair  Insight:  Shallow  Psychomotor Activity:  Normal  Concentration:  Concentration: Fair and Attention Span: Fair  Recall:  [employer] of Knowledge:Fair  Language: Fair  Akathisia:  Negative  Handed:  Right  AIMS (if indicated):    Assets:  Desire for Improvement  ADL's:  Intact  Cognition: WNL  Sleep:  Fair on meds    Treatment Plan Summary: Medication management and Plan as follows   Bipolar disorder : depression has gone worse.  Alcohol use : infrequent  Depression is worse. Increase zoloft. Continue wellbutrin.  GAD; varies. Worse when depressed.  Focus on positive things. Schedule therapy. Increase zoloft   Insomnia: baseline. Continue trazadone  denies suicidal plan. Work in therapy for coping skills  Fu in 1 month or earlier if needed    Merian Capron, MD 10/18/201810:36 AM

## 2017-10-18 ENCOUNTER — Ambulatory Visit (HOSPITAL_COMMUNITY): Payer: Self-pay | Admitting: Licensed Clinical Social Worker

## 2017-11-02 ENCOUNTER — Ambulatory Visit (HOSPITAL_COMMUNITY): Payer: Self-pay | Admitting: Psychiatry

## 2017-11-03 ENCOUNTER — Telehealth (HOSPITAL_COMMUNITY): Payer: Self-pay

## 2017-11-03 MED ORDER — HYDROXYZINE PAMOATE 25 MG PO CAPS: 25.0000 mg | ORAL_CAPSULE | Freq: Every day | ORAL | 0 refills | Status: DC | PRN

## 2017-11-03 MED ORDER — RISPERIDONE 3 MG PO TABS: 3.0000 mg | ORAL_TABLET | Freq: Every day | ORAL | 0 refills | Status: DC

## 2017-11-03 MED ORDER — BUPROPION HCL ER (XL) 150 MG PO TB24: 450.0000 mg | ORAL_TABLET | Freq: Every day | ORAL | 0 refills | Status: DC

## 2017-11-03 MED ORDER — TRAZODONE HCL 100 MG PO TABS: 100.0000 mg | ORAL_TABLET | Freq: Every day | ORAL | 0 refills | Status: DC

## 2017-11-03 MED ORDER — LAMOTRIGINE 200 MG PO TABS: 200.0000 mg | ORAL_TABLET | Freq: Two times a day (BID) | ORAL | 0 refills | Status: DC

## 2017-11-03 MED ORDER — SERTRALINE HCL 100 MG PO TABS
250.0000 mg | ORAL_TABLET | Freq: Every day | ORAL | 0 refills | Status: DC
Start: 2017-11-03 — End: 2017-12-05

## 2017-11-03 NOTE — Telephone Encounter (Signed)
Patient called asking for a refill on all medications. He will pick up prescriptions to take to the [place] today. Please review and advise.

## 2017-11-03 NOTE — Telephone Encounter (Signed)
Medications refilled and printed, waiting for pt to pick them up. Patient notified.

## 2017-11-03 NOTE — Telephone Encounter (Signed)
Can refill. 

## 2017-11-09 ENCOUNTER — Ambulatory Visit (HOSPITAL_COMMUNITY): Payer: Self-pay | Admitting: Psychiatry

## 2017-11-17 ENCOUNTER — Ambulatory Visit (HOSPITAL_COMMUNITY): Payer: Self-pay | Admitting: Psychiatry

## 2017-12-05 ENCOUNTER — Telehealth (HOSPITAL_COMMUNITY): Payer: Self-pay | Admitting: Psychiatry

## 2017-12-05 ENCOUNTER — Other Ambulatory Visit (HOSPITAL_COMMUNITY): Payer: Self-pay | Admitting: Psychiatry

## 2017-12-05 MED ORDER — SERTRALINE HCL 100 MG PO TABS: 250.0000 mg | ORAL_TABLET | Freq: Every day | ORAL | 0 refills | Status: DC

## 2017-12-05 NOTE — Telephone Encounter (Signed)
Pt needs refill on zoloft.  Pt will need to pick up rx

## 2017-12-05 NOTE — Telephone Encounter (Signed)
Called patient and left a VM informing him that Dr. Melanee Left wrote the rx for him and it is waiting at the front desk. Nothing further is needed at this time.

## 2017-12-05 NOTE — Telephone Encounter (Signed)
Prescription written for 30 day supply

## 2017-12-05 NOTE — Telephone Encounter (Signed)
Dr. Melanee Left are you willing to write an RX for Zoloft for this patient. He is a patient of Dr. De Nurse and he needs a written RX to take to the [place]. ( I can print it and have you sign it if you want me to). His next office visit is on 12/23/17. Please review and advise.

## 2017-12-23 ENCOUNTER — Ambulatory Visit (HOSPITAL_COMMUNITY): Payer: Non-veteran care | Admitting: Psychiatry

## 2018-01-02 ENCOUNTER — Ambulatory Visit (HOSPITAL_COMMUNITY): Payer: Non-veteran care | Admitting: Psychiatry

## 2018-01-10 ENCOUNTER — Telehealth (HOSPITAL_COMMUNITY): Payer: Self-pay

## 2018-01-10 ENCOUNTER — Ambulatory Visit (HOSPITAL_COMMUNITY): Payer: Non-veteran care | Admitting: Psychiatry

## 2018-01-10 MED ORDER — LAMOTRIGINE 200 MG PO TABS: 200.0000 mg | ORAL_TABLET | Freq: Two times a day (BID) | ORAL | 0 refills | Status: DC

## 2018-01-10 MED ORDER — RISPERIDONE 3 MG PO TABS: 3.0000 mg | ORAL_TABLET | Freq: Every day | ORAL | 0 refills | Status: DC

## 2018-01-10 MED ORDER — BUPROPION HCL ER (XL) 150 MG PO TB24: 450.0000 mg | ORAL_TABLET | Freq: Every day | ORAL | 0 refills | Status: DC

## 2018-01-10 MED ORDER — HYDROXYZINE PAMOATE 25 MG PO CAPS: 25.0000 mg | ORAL_CAPSULE | Freq: Every day | ORAL | 0 refills | Status: DC | PRN

## 2018-01-10 MED ORDER — TRAZODONE HCL 100 MG PO TABS: 100.0000 mg | ORAL_TABLET | Freq: Every day | ORAL | 0 refills | Status: DC

## 2018-01-10 MED ORDER — SERTRALINE HCL 100 MG PO TABS: 250.0000 mg | ORAL_TABLET | Freq: Every day | ORAL | 0 refills | Status: DC

## 2018-01-10 NOTE — Telephone Encounter (Signed)
Patient had to cancel appointment due to him having diarrhea and coughing really bad. He needs refills on all medication. He states his wife can pick up written prescriptions and take them to the [place]. Please review and advise.

## 2018-01-10 NOTE — Telephone Encounter (Signed)
Printed out refill for all of patients' medications per Dr. De Nurse. Left VM informing patient. Nothing further is needed at this time.

## 2018-01-10 NOTE — Telephone Encounter (Signed)
Can have meds refill

## 2018-02-01 ENCOUNTER — Ambulatory Visit (INDEPENDENT_AMBULATORY_CARE_PROVIDER_SITE_OTHER): Payer: Medicare Other | Admitting: Psychiatry

## 2018-02-01 ENCOUNTER — Encounter (HOSPITAL_COMMUNITY): Payer: Self-pay | Admitting: Psychiatry

## 2018-02-01 DIAGNOSIS — F411 Generalized anxiety disorder: Secondary | ICD-10-CM | POA: Diagnosis not present

## 2018-02-01 DIAGNOSIS — F3130 Bipolar disorder, current episode depressed, mild or moderate severity, unspecified: Secondary | ICD-10-CM

## 2018-02-01 DIAGNOSIS — F5102 Adjustment insomnia: Secondary | ICD-10-CM | POA: Diagnosis not present

## 2018-02-01 DIAGNOSIS — Z736 Limitation of activities due to disability: Secondary | ICD-10-CM | POA: Diagnosis not present

## 2018-02-01 MED ORDER — BUPROPION HCL ER (XL) 150 MG PO TB24: 450.0000 mg | ORAL_TABLET | Freq: Every day | ORAL | 0 refills | Status: DC

## 2018-02-01 MED ORDER — RISPERIDONE 3 MG PO TABS: 3.0000 mg | ORAL_TABLET | Freq: Every day | ORAL | 0 refills | Status: DC

## 2018-02-01 MED ORDER — HYDROXYZINE PAMOATE 25 MG PO CAPS: 25.0000 mg | ORAL_CAPSULE | Freq: Every day | ORAL | 0 refills | Status: DC | PRN

## 2018-02-01 MED ORDER — TRAZODONE HCL 100 MG PO TABS: 100.0000 mg | ORAL_TABLET | Freq: Every day | ORAL | 0 refills | Status: DC

## 2018-02-01 MED ORDER — LAMOTRIGINE 200 MG PO TABS: 200.0000 mg | ORAL_TABLET | Freq: Two times a day (BID) | ORAL | 0 refills | Status: DC

## 2018-02-01 NOTE — Progress Notes (Signed)
[facility] Outpatient Follow up visit   Patient Identification: ODIE EDMONDS MRN:  [id number] Date of Evaluation:  02/01/2018 Referral Source: [place] system Chief Complaint:   Chief Complaint    Follow-up     Visit Diagnosis:    ICD-10-CM   1. Bipolar I disorder, most recent episode depressed ([place]) F31.30   2. GAD (generalized anxiety disorder) F41.1   3. 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    Has been a no show recently. Says got GI infection was admitted in hospital.  Tolerating meds not manic . Feels meds does keep balance.  Once he starts worrying about things it gets him down.  Supportive wife zoloft was increased last visit has helped  Aggravating factor:step daughter died 03/30/16  Stressful military service Pituitary adenoma Modifying factor: va disability,  wife Associated Signs/Symptoms: No psychosis or paranoia  Past Psychiatric History: First hospitalization 1989 tried to hang himself while in [employer].  03-31-03. Diagnosed with bipolar with psychotic features admitted in hospital 03-30-2013 manic episode.      Past Medical History:  Past Medical History:  Diagnosis Date  . Gout   . High blood pressure   . Pituitary adenoma ([place])    History reviewed. No pertinent surgical history.    Family History: History reviewed. No pertinent family history.  Social History:   Social History   Socioeconomic History  . Marital status: Single    Spouse name: None  . Number of children: None  . Years of education: None  . Highest education level: None  Social Needs  . Financial resource strain: None  . Food insecurity - worry: None  . Food insecurity - inability: None  . Transportation needs -  medical: None  . Transportation needs - non-medical: None  Occupational History  . None  Tobacco Use  . Smoking status: Never Smoker  . Smokeless tobacco: Never Used  Substance and Sexual Activity  . Alcohol use: No  . Drug use: No  . Sexual activity: Yes    Partners: Female  Other Topics Concern  . None  Social History Narrative  . None     Allergies:  No Known Allergies  Metabolic Disorder Labs: No results found for: HGBA1C, MPG No results found for: PROLACTIN No results found for: CHOL, TRIG, HDL, CHOLHDL, VLDL, LDLCALC   Current Medications: Current Outpatient Medications  Medication Sig Dispense Refill  . allopurinol (ZYLOPRIM) 300 MG tablet Take 300 mg by mouth.    Marland Kitchen aspirin 81 MG chewable tablet Chew 81 mg by mouth.    Marland Kitchen buPROPion (WELLBUTRIN XL) 150 MG 24 hr tablet Take 3 tablets (450 mg total) by mouth daily. 90 tablet 0  . cabergoline (DOSTINEX) 0.5 MG tablet Take 0.25 mg by mouth.    . carvedilol (COREG) 6.25 MG tablet Take 6.25 mg by mouth.    . Cyanocobalamin (RA VITAMIN B-12 TR) 1000 MCG TBCR Take by mouth.    . diclofenac (CATAFLAM) 50 MG tablet Take 50 mg by mouth.    . DOCOSAHEXAENOIC ACID PO Take 1 g by mouth.    . fluticasone (FLONASE) 50 MCG/ACT nasal spray Place into the nose.    . furosemide (LASIX)  20 MG tablet Take 20 mg by mouth.    . hydrOXYzine (VISTARIL) 25 MG capsule Take 1 capsule (25 mg total) by mouth daily as needed for itching. 30 capsule 0  . lamoTRIgine (LAMICTAL) 200 MG tablet Take 1 tablet (200 mg total) by mouth 2 (two) times daily. 60 tablet 0  . loratadine (CLARITIN) 10 MG tablet Take 10 mg by mouth.    Marland Kitchen omeprazole (PRILOSEC) 20 MG capsule Take 20 mg by mouth.    . risperiDONE (RISPERDAL) 3 MG tablet Take 1 tablet (3 mg total) by mouth daily. 30 tablet 0  . rosuvastatin (CRESTOR) 20 MG tablet Take 10 mg by mouth.    . sertraline (ZOLOFT) 100 MG tablet Take 2.5 tablets (250 mg total) by mouth daily. 75 tablet 0  . testosterone  enanthate (DELATESTRYL) 200 MG/ML injection Inject into the muscle.    . traZODone (DESYREL) 100 MG tablet Take 1 tablet (100 mg total) by mouth at bedtime. 30 tablet 0  . valsartan (DIOVAN) 160 MG tablet Take 160 mg by mouth.     No current facility-administered medications for this visit.       Psychiatric Specialty Exam: Review of Systems  Cardiovascular: Negative for chest pain.  Gastrointestinal: Negative for nausea.  Skin: Negative for rash.  Neurological: Negative for tremors.  Psychiatric/Behavioral: Negative for substance abuse and suicidal ideas.    There were no vitals taken for this visit.There is no height or weight on file to calculate BMI.  General Appearance: Casual  Eye Contact:  Fair  Speech:  Normal Rate  Volume:  Normal  Mood: somewhat better today and not depressed  Affect:  congruent  Thought Process:  Goal Directed  Orientation:  Full (Time, Place, and Person)  Thought Content:  Rumination  Suicidal Thoughts:  No  Homicidal Thoughts:  No  Memory:  Immediate;   Fair Recent;   Fair  Judgement:  Fair  Insight:  Shallow  Psychomotor Activity:  Normal  Concentration:  Concentration: Fair and Attention Span: Fair  Recall:  [employer] of Knowledge:Fair  Language: Fair  Akathisia:  Negative  Handed:  Right  AIMS (if indicated):    Assets:  Desire for Improvement  ADL's:  Intact  Cognition: WNL  Sleep:  Fair on meds    Treatment Plan Summary: Medication management and Plan as follows   Bipolar disorder : depression ; baseline. Feels better compared to average last year  continue current meds continue wellbutrin, zoloft Alcohol use : denies or infrequent use  GAD; varies. Not worse  Focus on positive things. Schedule therapy. Increase zoloft   Insomnia: baseline. Continue trazadone  denies suicidal plan. Work in therapy for coping skills  Fu in 1 month Wants to come in 3 m.  Will renew more by phone    Merian Capron, MD 2/27/20191:35  PM

## 2018-03-13 ENCOUNTER — Other Ambulatory Visit (HOSPITAL_COMMUNITY): Payer: Self-pay | Admitting: Psychiatry

## 2018-03-13 MED ORDER — RISPERIDONE 3 MG PO TABS: 3.0000 mg | ORAL_TABLET | Freq: Every day | ORAL | 0 refills | Status: DC

## 2018-03-13 MED ORDER — LAMOTRIGINE 200 MG PO TABS: 200.0000 mg | ORAL_TABLET | Freq: Two times a day (BID) | ORAL | 0 refills | Status: DC

## 2018-03-13 MED ORDER — TRAZODONE HCL 100 MG PO TABS: 100.0000 mg | ORAL_TABLET | Freq: Every day | ORAL | 0 refills | Status: DC

## 2018-03-13 NOTE — Telephone Encounter (Signed)
This has to be printed out and signed by Dr. De Nurse since patient uses the [facility] pharmacy.

## 2018-03-13 NOTE — Telephone Encounter (Signed)
Pt needs refill on risperdal cb # [phone]

## 2018-03-13 NOTE — Telephone Encounter (Signed)
Patient called back requesting that I send in Risperidone 3mg , Lamictal 200mg  and Trazodone 100mg  to [facility]. Patient stated that he was completely out of medication. I sent a one month supply of all three medications per Dr. De Nurse. Nothing further is needed at this time.

## 2018-03-14 ENCOUNTER — Other Ambulatory Visit (HOSPITAL_COMMUNITY): Payer: Self-pay | Admitting: Psychiatry

## 2018-03-14 MED ORDER — BUPROPION HCL ER (XL) 150 MG PO TB24: 450.0000 mg | ORAL_TABLET | Freq: Every day | ORAL | 0 refills | Status: DC

## 2018-03-14 NOTE — Telephone Encounter (Signed)
Pt needs refill on bupropion sent to [facility].

## 2018-03-14 NOTE — Telephone Encounter (Signed)
Sent in a one month supply of bupropion per Dr. De Nurse, Informed patient of refill. Nothing further is needed at this time.

## 2018-03-21 ENCOUNTER — Other Ambulatory Visit (HOSPITAL_COMMUNITY): Payer: Self-pay

## 2018-03-21 MED ORDER — SERTRALINE HCL 100 MG PO TABS: 250.0000 mg | ORAL_TABLET | Freq: Every day | ORAL | 0 refills | Status: DC

## 2018-03-29 ENCOUNTER — Telehealth (HOSPITAL_COMMUNITY): Payer: Self-pay

## 2018-03-29 NOTE — Telephone Encounter (Signed)
Patient would like for [place] to fax a prescription for Trazodone and Lamotrigine to the [place]. He states he still has some medication but it takes at least 12 days for the [place] to process and send it to him after they receive the prescription. Please review and advise.

## 2018-03-30 MED ORDER — TRAZODONE HCL 100 MG PO TABS: 100.0000 mg | ORAL_TABLET | Freq: Every day | ORAL | 0 refills | Status: DC

## 2018-03-30 MED ORDER — LAMOTRIGINE 200 MG PO TABS: 200.0000 mg | ORAL_TABLET | Freq: Two times a day (BID) | ORAL | 0 refills | Status: DC

## 2018-03-30 NOTE — Telephone Encounter (Signed)
Prescriptions printed, signed by Dr. De Nurse and faxed to the [place]. [phone]

## 2018-04-13 ENCOUNTER — Other Ambulatory Visit (HOSPITAL_COMMUNITY): Payer: Self-pay

## 2018-04-13 MED ORDER — RISPERIDONE 3 MG PO TABS: 3.0000 mg | ORAL_TABLET | Freq: Every day | ORAL | 0 refills | Status: DC

## 2018-04-13 MED ORDER — LAMOTRIGINE 200 MG PO TABS: 200.0000 mg | ORAL_TABLET | Freq: Two times a day (BID) | ORAL | 0 refills | Status: DC

## 2018-04-13 NOTE — Progress Notes (Signed)
Patient called stating that the [place] did not receive faxed prescriptions we sent on 03/21/18. Patient was out of Risperidone and Lamicatal. Sent one month supply of both medications to [facility].

## 2018-04-17 ENCOUNTER — Telehealth (HOSPITAL_COMMUNITY): Payer: Self-pay

## 2018-04-18 ENCOUNTER — Other Ambulatory Visit (HOSPITAL_COMMUNITY): Payer: Self-pay

## 2018-04-18 MED ORDER — HYDROXYZINE PAMOATE 25 MG PO CAPS: 25.0000 mg | ORAL_CAPSULE | Freq: Every day | ORAL | 0 refills | Status: DC | PRN

## 2018-04-18 MED ORDER — TRAZODONE HCL 100 MG PO TABS: 100.0000 mg | ORAL_TABLET | Freq: Every day | ORAL | 0 refills | Status: DC

## 2018-04-18 MED ORDER — SERTRALINE HCL 100 MG PO TABS: 250.0000 mg | ORAL_TABLET | Freq: Every day | ORAL | 0 refills | Status: DC

## 2018-04-18 MED ORDER — BUPROPION HCL ER (XL) 150 MG PO TB24: 450.0000 mg | ORAL_TABLET | Freq: Every day | ORAL | 0 refills | Status: DC

## 2018-04-26 ENCOUNTER — Ambulatory Visit (INDEPENDENT_AMBULATORY_CARE_PROVIDER_SITE_OTHER): Payer: Medicare Other | Admitting: Psychiatry

## 2018-04-26 ENCOUNTER — Other Ambulatory Visit: Payer: Self-pay

## 2018-04-26 ENCOUNTER — Ambulatory Visit (HOSPITAL_COMMUNITY): Payer: Self-pay | Admitting: Psychiatry

## 2018-04-26 ENCOUNTER — Encounter (HOSPITAL_COMMUNITY): Payer: Self-pay | Admitting: Psychiatry

## 2018-04-26 VITALS — BP 138/88 | HR 67 | Ht 74.0 in | Wt 336.0 lb

## 2018-04-26 DIAGNOSIS — F411 Generalized anxiety disorder: Secondary | ICD-10-CM | POA: Diagnosis not present

## 2018-04-26 DIAGNOSIS — F5102 Adjustment insomnia: Secondary | ICD-10-CM | POA: Diagnosis not present

## 2018-04-26 DIAGNOSIS — Z736 Limitation of activities due to disability: Secondary | ICD-10-CM

## 2018-04-26 DIAGNOSIS — F3130 Bipolar disorder, current episode depressed, mild or moderate severity, unspecified: Secondary | ICD-10-CM | POA: Diagnosis not present

## 2018-04-26 MED ORDER — TRAZODONE HCL 100 MG PO TABS: 100.0000 mg | ORAL_TABLET | Freq: Every day | ORAL | 2 refills | Status: DC

## 2018-04-26 MED ORDER — SERTRALINE HCL 100 MG PO TABS: 250.0000 mg | ORAL_TABLET | Freq: Every day | ORAL | 2 refills | Status: DC

## 2018-04-26 MED ORDER — LAMOTRIGINE 200 MG PO TABS: 200.0000 mg | ORAL_TABLET | Freq: Two times a day (BID) | ORAL | 2 refills | Status: DC

## 2018-04-26 MED ORDER — HYDROXYZINE PAMOATE 25 MG PO CAPS: 25.0000 mg | ORAL_CAPSULE | Freq: Every day | ORAL | 2 refills | Status: DC | PRN

## 2018-04-26 MED ORDER — RISPERIDONE 3 MG PO TABS: 3.0000 mg | ORAL_TABLET | Freq: Every day | ORAL | 2 refills | Status: DC

## 2018-04-26 MED ORDER — BUPROPION HCL ER (XL) 150 MG PO TB24: 450.0000 mg | ORAL_TABLET | Freq: Every day | ORAL | 2 refills | Status: DC

## 2018-04-26 NOTE — Progress Notes (Signed)
[facility] Outpatient Follow up visit   Patient Identification: Kevin Savage MRN:  [id number] Date of Evaluation:  04/26/2018 Referral Source: [place] system Chief Complaint:   Chief Complaint    Follow-up; Other     Visit Diagnosis:    ICD-10-CM   1. Bipolar I disorder, most recent episode depressed ([place]) F31.30   2. GAD (generalized anxiety disorder) F41.1   3. 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   Doing fair on meds. Gets depressed at times. Avoids going out due to feelin anxious around people Worries fluctuate  Supportive wife   Aggravating factor:step daughter died [id number]  Stressful military service Pituitary adenoma Modifying factor: va disability,  wife Associated Signs/Symptoms: No psychosis or paranoia  Past Psychiatric History: First hospitalization 1989 tried to hang himself while in [employer].  2004. Diagnosed with bipolar with psychotic features admitted in hospital 2014 manic episode.      Past Medical History:  Past Medical History:  Diagnosis Date  . Gout   . High blood pressure   . Pituitary adenoma ([place])    History reviewed. No pertinent surgical history.    Family History: History reviewed. No pertinent family history.  Social History:   Social History   Socioeconomic History  . Marital status: Single    Spouse name: Not on file  . Number of children: Not on file  . Years of education: Not on file  . Highest education level: Not on file  Occupational History  . Not on file  Social Needs  . Financial resource strain: Not on file  . Food insecurity:    Worry: Not on file    Inability: Not on file  . Transportation needs:    Medical: Not on file    Non-medical:  Not on file  Tobacco Use  . Smoking status: Never Smoker  . Smokeless tobacco: Never Used  Substance and Sexual Activity  . Alcohol use: No  . Drug use: No  . Sexual activity: Yes    Partners: Female  Lifestyle  . Physical activity:    Days per week: Not on file    Minutes per session: Not on file  . Stress: Not on file  Relationships  . Social connections:    Talks on phone: Not on file    Gets together: Not on file    Attends religious service: Not on file    Active member of club or organization: Not on file    Attends meetings of clubs or organizations: Not on file    Relationship status: Not on file  Other Topics Concern  . Not on file  Social History Narrative  . Not on file     Allergies:  No Known Allergies  Metabolic Disorder Labs: No results found for: HGBA1C, MPG No results found for: PROLACTIN No results found for: CHOL, TRIG, HDL, CHOLHDL, VLDL, LDLCALC   Current Medications: Current Outpatient Medications  Medication Sig Dispense Refill  . allopurinol (ZYLOPRIM) 300 MG tablet Take 300 mg by mouth.    Marland Kitchen aspirin 81 MG chewable tablet Chew 81 mg by mouth.    Marland Kitchen buPROPion (WELLBUTRIN XL) 150 MG 24 hr tablet Take 3 tablets (450  mg total) by mouth daily. 90 tablet 2  . cabergoline (DOSTINEX) 0.5 MG tablet Take 0.25 mg by mouth.    . carvedilol (COREG) 6.25 MG tablet Take 6.25 mg by mouth.    . Cyanocobalamin (RA VITAMIN B-12 TR) 1000 MCG TBCR Take by mouth.    . diclofenac (CATAFLAM) 50 MG tablet Take 50 mg by mouth.    . DOCOSAHEXAENOIC ACID PO Take 1 g by mouth.    . fluticasone (FLONASE) 50 MCG/ACT nasal spray Place into the nose.    . furosemide (LASIX) 20 MG tablet Take 20 mg by mouth.    . hydrOXYzine (VISTARIL) 25 MG capsule Take 1 capsule (25 mg total) by mouth daily as needed for itching. 30 capsule 2  . lamoTRIgine (LAMICTAL) 200 MG tablet Take 1 tablet (200 mg total) by mouth 2 (two) times daily. 60 tablet 2  . loratadine (CLARITIN) 10 MG tablet  Take 10 mg by mouth.    Marland Kitchen omeprazole (PRILOSEC) 20 MG capsule Take 20 mg by mouth.    . risperiDONE (RISPERDAL) 3 MG tablet Take 1 tablet (3 mg total) by mouth daily. 30 tablet 2  . rosuvastatin (CRESTOR) 20 MG tablet Take 10 mg by mouth.    . sertraline (ZOLOFT) 100 MG tablet Take 2.5 tablets (250 mg total) by mouth daily. 75 tablet 2  . testosterone enanthate (DELATESTRYL) 200 MG/ML injection Inject into the muscle.    . traZODone (DESYREL) 100 MG tablet Take 1 tablet (100 mg total) by mouth at bedtime. 30 tablet 2  . valsartan (DIOVAN) 160 MG tablet Take 160 mg by mouth.     No current facility-administered medications for this visit.       Psychiatric Specialty Exam: Review of Systems  Cardiovascular: Negative for palpitations.  Gastrointestinal: Negative for nausea.  Skin: Negative for rash.  Neurological: Negative for tremors.  Psychiatric/Behavioral: Negative for substance abuse and suicidal ideas.    Blood pressure 138/88, pulse 67, height 6\' 2"  (1.88 m), weight (!) 336 lb (152.4 kg).Body mass index is 43.14 kg/m.  General Appearance: Casual  Eye Contact:  Fair  Speech:  Normal Rate  Volume:  Normal  Mood: not worse. Still subdued easily  Affect:  congruent  Thought Process:  Goal Directed  Orientation:  Full (Time, Place, and Person)  Thought Content:  Rumination  Suicidal Thoughts:  No  Homicidal Thoughts:  No  Memory:  Immediate;   Fair Recent;   Fair  Judgement:  Fair  Insight:  Shallow  Psychomotor Activity:  Normal  Concentration:  Concentration: Fair and Attention Span: Fair  Recall:  [employer] of Knowledge:Fair  Language: Fair  Akathisia:  Negative  Handed:  Right  AIMS (if indicated):    Assets:  Desire for Improvement  ADL's:  Intact  Cognition: WNL  Sleep:  Fair on meds    Treatment Plan Summary: Medication management and Plan as follows   Bipolar disorder : depression ; baseline. Continue risperdal and lamcital Alcohol use : denies or  infrequent use  GAD;vaires. Continue zoloft   Insomnia: baseline. Continue trazadone  denies suicidal plan. Work in therapy for coping skills  Fu 2-3 months. presciptions printed    Merian Capron, MD 5/22/20199:14 AM

## 2018-07-06 ENCOUNTER — Other Ambulatory Visit (HOSPITAL_COMMUNITY): Payer: Self-pay | Admitting: Psychiatry

## 2018-07-06 MED ORDER — SERTRALINE HCL 100 MG PO TABS: 250.0000 mg | ORAL_TABLET | Freq: Every day | ORAL | 0 refills | Status: DC

## 2018-07-06 MED ORDER — BUPROPION HCL ER (XL) 150 MG PO TB24: 450.0000 mg | ORAL_TABLET | Freq: Every day | ORAL | 0 refills | Status: DC

## 2018-07-06 MED ORDER — TRAZODONE HCL 100 MG PO TABS: 100.0000 mg | ORAL_TABLET | Freq: Every day | ORAL | 0 refills | Status: DC

## 2018-07-06 MED ORDER — HYDROXYZINE PAMOATE 25 MG PO CAPS: 25.0000 mg | ORAL_CAPSULE | Freq: Every day | ORAL | 0 refills | Status: DC | PRN

## 2018-07-06 MED ORDER — RISPERIDONE 3 MG PO TABS: 3.0000 mg | ORAL_TABLET | Freq: Every day | ORAL | 0 refills | Status: DC

## 2018-07-06 MED ORDER — LAMOTRIGINE 200 MG PO TABS: 200.0000 mg | ORAL_TABLET | Freq: Two times a day (BID) | ORAL | 0 refills | Status: DC

## 2018-07-06 NOTE — Telephone Encounter (Signed)
Sent over a month supply for Wellbutrin, Hydroxyzine, Lamictal, Risperdal, Sertraline and Trazodone to [facility].

## 2018-07-06 NOTE — Telephone Encounter (Signed)
Pt needs refills on all meds sent to [place]

## 2018-07-06 NOTE — Telephone Encounter (Signed)
[facility] can send refills if due

## 2018-08-08 ENCOUNTER — Ambulatory Visit (HOSPITAL_COMMUNITY): Payer: Self-pay | Admitting: Psychiatry

## 2018-08-09 ENCOUNTER — Other Ambulatory Visit (HOSPITAL_COMMUNITY): Payer: Self-pay

## 2018-08-09 MED ORDER — TRAZODONE HCL 100 MG PO TABS: 100.0000 mg | ORAL_TABLET | Freq: Every day | ORAL | 0 refills | Status: DC

## 2018-08-09 MED ORDER — BUPROPION HCL ER (XL) 150 MG PO TB24: 450.0000 mg | ORAL_TABLET | Freq: Every day | ORAL | 0 refills | Status: DC

## 2018-08-09 MED ORDER — LAMOTRIGINE 200 MG PO TABS: 200.0000 mg | ORAL_TABLET | Freq: Two times a day (BID) | ORAL | 0 refills | Status: DC

## 2018-08-09 MED ORDER — SERTRALINE HCL 100 MG PO TABS: 250.0000 mg | ORAL_TABLET | Freq: Every day | ORAL | 0 refills | Status: DC

## 2018-08-09 MED ORDER — HYDROXYZINE PAMOATE 25 MG PO CAPS: 25.0000 mg | ORAL_CAPSULE | Freq: Every day | ORAL | 0 refills | Status: DC | PRN

## 2018-08-09 MED ORDER — RISPERIDONE 3 MG PO TABS: 3.0000 mg | ORAL_TABLET | Freq: Every day | ORAL | 0 refills | Status: DC

## 2018-08-21 ENCOUNTER — Ambulatory Visit (HOSPITAL_COMMUNITY): Payer: Self-pay | Admitting: Licensed Clinical Social Worker

## 2018-08-22 ENCOUNTER — Encounter (HOSPITAL_COMMUNITY): Payer: Self-pay | Admitting: Psychiatry

## 2018-08-22 ENCOUNTER — Ambulatory Visit (INDEPENDENT_AMBULATORY_CARE_PROVIDER_SITE_OTHER): Payer: Medicare Other | Admitting: Psychiatry

## 2018-08-22 VITALS — BP 120/82 | HR 96 | Ht 74.0 in | Wt 314.0 lb

## 2018-08-22 DIAGNOSIS — F3130 Bipolar disorder, current episode depressed, mild or moderate severity, unspecified: Secondary | ICD-10-CM | POA: Diagnosis not present

## 2018-08-22 DIAGNOSIS — F5102 Adjustment insomnia: Secondary | ICD-10-CM | POA: Diagnosis not present

## 2018-08-22 DIAGNOSIS — F411 Generalized anxiety disorder: Secondary | ICD-10-CM

## 2018-08-22 MED ORDER — LAMOTRIGINE 200 MG PO TABS: 200.0000 mg | ORAL_TABLET | Freq: Two times a day (BID) | ORAL | 0 refills | Status: DC

## 2018-08-22 MED ORDER — BUPROPION HCL ER (XL) 150 MG PO TB24: 450.0000 mg | ORAL_TABLET | Freq: Every day | ORAL | 0 refills | Status: DC

## 2018-08-22 MED ORDER — HYDROXYZINE PAMOATE 25 MG PO CAPS: 25.0000 mg | ORAL_CAPSULE | Freq: Every day | ORAL | 0 refills | Status: DC | PRN

## 2018-08-22 MED ORDER — TRAZODONE HCL 100 MG PO TABS: 100.0000 mg | ORAL_TABLET | Freq: Every day | ORAL | 0 refills | Status: DC

## 2018-08-22 MED ORDER — SERTRALINE HCL 100 MG PO TABS: 250.0000 mg | ORAL_TABLET | Freq: Every day | ORAL | 0 refills | Status: DC

## 2018-08-22 MED ORDER — RISPERIDONE 3 MG PO TABS: 3.0000 mg | ORAL_TABLET | Freq: Every day | ORAL | 0 refills | Status: DC

## 2018-08-22 NOTE — Progress Notes (Signed)
[facility] Outpatient Follow up visit   Patient Identification: Kevin Savage MRN:  [id number] Date of Evaluation:  08/22/2018 Referral Source: [place] system Chief Complaint:   Chief Complaint    Follow-up     Visit Diagnosis:    ICD-10-CM   1. Bipolar I disorder, most recent episode depressed ([place]) F31.30   2. GAD (generalized anxiety disorder) F41.1   3. 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   Doing fair on meds. Not going to [place] using medicare to go to primary care and changed pharmacy Supportive wife Not deperssed. No side effects Feels fair compared to last visits No rash   Aggravating factor:step daughter 2017  Stressful military service Pituitary adenoma Modifying factor: VA disability, wife Duration : more then 15 years  Associated Signs/Symptoms: No psychosis or paranoia  Past Psychiatric History: First hospitalization 1989 tried to hang himself while in [employer].  2004. Diagnosed with bipolar with psychotic features admitted in hospital 2014 manic episode.      Past Medical History:  Past Medical History:  Diagnosis Date  . Gout   . High blood pressure   . Pituitary adenoma ([place])    History reviewed. No pertinent surgical history.    Family History: History reviewed. No pertinent family history.  Social History:   Social History   Socioeconomic History  . Marital status: Single    Spouse name: Not on file  . Number of children: Not on file  . Years of education: Not on file  . Highest education level: Not on file  Occupational History  . Not on file  Social Needs  . Financial resource strain: Not on file  . Food insecurity:    Worry: Not on file    Inability: Not on  file  . Transportation needs:    Medical: Not on file    Non-medical: Not on file  Tobacco Use  . Smoking status: Never Smoker  . Smokeless tobacco: Never Used  Substance and Sexual Activity  . Alcohol use: No  . Drug use: No  . Sexual activity: Yes    Partners: Female  Lifestyle  . Physical activity:    Days per week: Not on file    Minutes per session: Not on file  . Stress: Not on file  Relationships  . Social connections:    Talks on phone: Not on file    Gets together: Not on file    Attends religious service: Not on file    Active member of club or organization: Not on file    Attends meetings of clubs or organizations: Not on file    Relationship status: Not on file  Other Topics Concern  . Not on file  Social History Narrative  . Not on file     Allergies:  No Known Allergies  Metabolic Disorder Labs: No results found for: HGBA1C, MPG No results found for: PROLACTIN No results found for: CHOL, TRIG, HDL, CHOLHDL, VLDL, LDLCALC   Current Medications: Current Outpatient Medications  Medication Sig Dispense Refill  . aspirin 81 MG chewable tablet Chew 81 mg by mouth.    Marland Kitchen buPROPion (WELLBUTRIN XL) 150 MG 24 hr tablet Take 3 tablets (  450 mg total) by mouth daily. 90 tablet 0  . cabergoline (DOSTINEX) 0.5 MG tablet Take 0.25 mg by mouth.    . carvedilol (COREG) 6.25 MG tablet Take 6.25 mg by mouth.    . Cyanocobalamin (RA VITAMIN B-12 TR) 1000 MCG TBCR Take by mouth.    . diclofenac (CATAFLAM) 50 MG tablet Take 50 mg by mouth.    . DOCOSAHEXAENOIC ACID PO Take 1 g by mouth.    . fluticasone (FLONASE) 50 MCG/ACT nasal spray Place into the nose.    . furosemide (LASIX) 20 MG tablet Take 20 mg by mouth.    . hydrOXYzine (VISTARIL) 25 MG capsule Take 1 capsule (25 mg total) by mouth daily as needed for itching. 30 capsule 0  . lamoTRIgine (LAMICTAL) 200 MG tablet Take 1 tablet (200 mg total) by mouth 2 (two) times daily. 60 tablet 0  . loratadine (CLARITIN) 10 MG  tablet Take 10 mg by mouth.    . Misc Natural Products (DANDELION ROOT PO) Take by mouth.    . omega-3 acid ethyl esters (LOVAZA) 1 g capsule Take by mouth daily.    Marland Kitchen omeprazole (PRILOSEC) 20 MG capsule Take 20 mg by mouth.    . risperiDONE (RISPERDAL) 3 MG tablet Take 1 tablet (3 mg total) by mouth daily. 30 tablet 0  . traZODone (DESYREL) 100 MG tablet Take 1 tablet (100 mg total) by mouth at bedtime. 30 tablet 0  . valsartan (DIOVAN) 160 MG tablet Take 160 mg by mouth.    Marland Kitchen allopurinol (ZYLOPRIM) 300 MG tablet Take 300 mg by mouth.    . rosuvastatin (CRESTOR) 20 MG tablet Take 10 mg by mouth.    . sertraline (ZOLOFT) 100 MG tablet Take 2.5 tablets (250 mg total) by mouth daily. 75 tablet 0  . testosterone enanthate (DELATESTRYL) 200 MG/ML injection Inject into the muscle.     No current facility-administered medications for this visit.       Psychiatric Specialty Exam: Review of Systems  Cardiovascular: Negative for chest pain.  Gastrointestinal: Negative for nausea.  Skin: Negative for rash.  Neurological: Negative for tremors.  Psychiatric/Behavioral: Negative for depression, substance abuse and suicidal ideas.    Blood pressure 120/82, pulse 96, height 6\' 2"  (1.88 m), weight (!) 314 lb (142.4 kg), SpO2 93 %.Body mass index is 40.32 kg/m.  General Appearance: Casual  Eye Contact:  Fair  Speech:  Normal Rate  Volume:  Normal  Mood: doing better  Affect:  congruent  Thought Process:  Goal Directed  Orientation:  Full (Time, Place, and Person)  Thought Content:  Rumination  Suicidal Thoughts:  No  Homicidal Thoughts:  No  Memory:  Immediate;   Fair Recent;   Fair  Judgement:  Fair  Insight:  Shallow  Psychomotor Activity:  Normal  Concentration:  Concentration: Fair and Attention Span: Fair  Recall:  [employer] of Knowledge:Fair  Language: Fair  Akathisia:  Negative  Handed:  Right  AIMS (if indicated):    Assets:  Desire for Improvement  ADL's:  Intact   Cognition: WNL  Sleep:  Fair on meds    Treatment Plan Summary: Medication management and Plan as follows   Bipolar disorder : depression ; better. Continue lamictal, risperdal  no rash, no tremors  Alcohol use : denies recent use  GAD;varies . Continue zoloft   Insomnia: baseline. Continue trazadone  denies suicidal plan. Work in therapy for coping skills  Fu 56m . Questions addressed   Emmie Frakes  De Nurse, MD 9/17/201910:22 AM

## 2018-11-09 ENCOUNTER — Ambulatory Visit (HOSPITAL_COMMUNITY): Payer: Medicare Other | Admitting: Psychiatry

## 2018-11-21 ENCOUNTER — Ambulatory Visit (HOSPITAL_COMMUNITY): Payer: Medicare Other | Admitting: Psychiatry

## 2019-10-31 ENCOUNTER — Encounter (HOSPITAL_COMMUNITY): Payer: Self-pay | Admitting: Psychiatry

## 2019-10-31 ENCOUNTER — Ambulatory Visit (INDEPENDENT_AMBULATORY_CARE_PROVIDER_SITE_OTHER): Payer: Medicare Other | Admitting: Psychiatry

## 2019-10-31 DIAGNOSIS — F411 Generalized anxiety disorder: Secondary | ICD-10-CM | POA: Diagnosis not present

## 2019-10-31 DIAGNOSIS — F5102 Adjustment insomnia: Secondary | ICD-10-CM | POA: Diagnosis not present

## 2019-10-31 DIAGNOSIS — F3130 Bipolar disorder, current episode depressed, mild or moderate severity, unspecified: Secondary | ICD-10-CM

## 2019-10-31 MED ORDER — RISPERIDONE 3 MG PO TABS: 3.0000 mg | ORAL_TABLET | Freq: Every day | ORAL | 2 refills | Status: DC

## 2019-10-31 MED ORDER — TRAZODONE HCL 100 MG PO TABS: 100.0000 mg | ORAL_TABLET | Freq: Every day | ORAL | 2 refills | Status: DC

## 2019-10-31 MED ORDER — BUPROPION HCL ER (XL) 150 MG PO TB24: 450.0000 mg | ORAL_TABLET | Freq: Every day | ORAL | 0 refills | Status: DC

## 2019-10-31 MED ORDER — SERTRALINE HCL 100 MG PO TABS: 250.0000 mg | ORAL_TABLET | Freq: Every day | ORAL | 2 refills | Status: DC

## 2019-10-31 MED ORDER — LAMOTRIGINE 200 MG PO TABS: 200.0000 mg | ORAL_TABLET | Freq: Two times a day (BID) | ORAL | 2 refills | Status: DC

## 2019-10-31 NOTE — Progress Notes (Signed)
[facility] Outpatient Follow up visit   Patient Identification: Kevin Savage MRN:  [id number] Date of Evaluation:  10/31/2019 Referral Source: [place] system Chief Complaint:   bipolar follow up  Visit Diagnosis:    ICD-10-CM   1. Bipolar I disorder, most recent episode depressed ([place])  F31.30   2. GAD (generalized anxiety disorder)  F41.1   3. Adjustment insomnia  F51.02     I connected with Kevin Savage on 10/31/19 at  1:00 PM EST by telephone and verified that I am speaking with the correct person using two identifiers.   I discussed the limitations, risks, security and privacy concerns of performing an evaluation and management service by telephone and the availability of in person appointments. I also discussed with the patient that there may be a patient responsible charge related to this service. The patient expressed understanding and agreed to proceed. 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   Doing fair on meds. Wanted to go back to [place] but for insurance and other reason had fu back with [place]. Continues to do reasonable Has joined wellness program to loose weight but did not go well . He wants to have bariatric surgery says he would be more depressed if doesn't get surgery as he wants to loose weight Wife is supportive   Not deperssed. No side effects Feels fair compared to last visits No rash   Aggravating factor: step daughter in past  Stressful military service Pituitary adenoma Modifying factor: [employer] disability, wife Duration : 15 years plus Associated Signs/Symptoms: No psychosis or paranoia  Past Psychiatric History: First hospitalization 1989 tried to hang himself while in [employer].  2004. Diagnosed with bipolar with  psychotic features admitted in hospital 2014 manic episode.      Past Medical History:  Past Medical History:  Diagnosis Date  . Gout   . High blood pressure   . Pituitary adenoma ([place])    History reviewed. No pertinent surgical history.    Family History: History reviewed. No pertinent family history.  Social History:   Social History   Socioeconomic History  . Marital status: Single    Spouse name: Not on file  . Number of children: Not on file  . Years of education: Not on file  . Highest education level: Not on file  Occupational History  . Not on file  Social Needs  . Financial resource strain: Not on file  . Food insecurity    Worry: Not on file    Inability: Not on file  . Transportation needs    Medical: Not on file    Non-medical: Not on file  Tobacco Use  . Smoking status: Never Smoker  . Smokeless tobacco: Never Used  Substance and Sexual Activity  . Alcohol use: No  . Drug use: No  . Sexual activity: Yes    Partners: Female  Lifestyle  . Physical activity    Days per week: Not on file    Minutes per session: Not on file  . Stress: Not on file  Relationships  . Social Herbalist on phone: Not on file    Gets together: Not on file    Attends religious service: Not on file  Active member of club or organization: Not on file    Attends meetings of clubs or organizations: Not on file    Relationship status: Not on file  Other Topics Concern  . Not on file  Social History Narrative  . Not on file     Allergies:  No Known Allergies  Metabolic Disorder Labs: No results found for: HGBA1C, MPG No results found for: PROLACTIN No results found for: CHOL, TRIG, HDL, CHOLHDL, VLDL, LDLCALC   Current Medications: Current Outpatient Medications  Medication Sig Dispense Refill  . allopurinol (ZYLOPRIM) 300 MG tablet Take 300 mg by mouth.    Marland Kitchen aspirin 81 MG chewable tablet Chew 81 mg by mouth.    Marland Kitchen buPROPion (WELLBUTRIN XL) 150 MG 24  hr tablet Take 3 tablets (450 mg total) by mouth daily. 90 tablet 0  . cabergoline (DOSTINEX) 0.5 MG tablet Take 0.25 mg by mouth.    . carvedilol (COREG) 6.25 MG tablet Take 6.25 mg by mouth.    . Cyanocobalamin (RA VITAMIN B-12 TR) 1000 MCG TBCR Take by mouth.    . diclofenac (CATAFLAM) 50 MG tablet Take 50 mg by mouth.    . DOCOSAHEXAENOIC ACID PO Take 1 g by mouth.    . fluticasone (FLONASE) 50 MCG/ACT nasal spray Place into the nose.    . furosemide (LASIX) 20 MG tablet Take 20 mg by mouth.    . hydrOXYzine (VISTARIL) 25 MG capsule Take 1 capsule (25 mg total) by mouth daily as needed for itching. 30 capsule 0  . lamoTRIgine (LAMICTAL) 200 MG tablet Take 1 tablet (200 mg total) by mouth 2 (two) times daily. 60 tablet 2  . loratadine (CLARITIN) 10 MG tablet Take 10 mg by mouth.    . Misc Natural Products (DANDELION ROOT PO) Take by mouth.    . omega-3 acid ethyl esters (LOVAZA) 1 g capsule Take by mouth daily.    Marland Kitchen omeprazole (PRILOSEC) 20 MG capsule Take 20 mg by mouth.    . risperiDONE (RISPERDAL) 3 MG tablet Take 1 tablet (3 mg total) by mouth daily. 30 tablet 2  . rosuvastatin (CRESTOR) 20 MG tablet Take 10 mg by mouth.    . sertraline (ZOLOFT) 100 MG tablet Take 2.5 tablets (250 mg total) by mouth daily. 75 tablet 2  . testosterone enanthate (DELATESTRYL) 200 MG/ML injection Inject into the muscle.    . traZODone (DESYREL) 100 MG tablet Take 1 tablet (100 mg total) by mouth at bedtime. 30 tablet 2  . valsartan (DIOVAN) 160 MG tablet Take 160 mg by mouth.     No current facility-administered medications for this visit.       Psychiatric Specialty Exam: Review of Systems  Cardiovascular: Negative for chest pain.  Skin: Negative for rash.  Psychiatric/Behavioral: Negative for depression, substance abuse and suicidal ideas.    There were no vitals taken for this visit.There is no height or weight on file to calculate BMI.  General Appearance:  Eye Contact:    Speech:  Normal  Rate  Volume:  Normal  Mood: doing better  Affect:  congruent  Thought Process:  Goal Directed  Orientation:  Full (Time, Place, and Person)  Thought Content:  Rumination  Suicidal Thoughts:  No  Homicidal Thoughts:  No  Memory:  Immediate;   Fair Recent;   Fair  Judgement:  Fair  Insight:  Shallow  Psychomotor Activity:  Normal  Concentration:  Concentration: Fair and Attention Span: Fair  Recall:  [employer]  of Knowledge:Fair  Language: Fair  Akathisia:  Negative  Handed:  Right  AIMS (if indicated):    Assets:  Desire for Improvement  ADL's:  Intact  Cognition: WNL  Sleep:  Fair on meds    Treatment Plan Summary: Medication management and Plan as follows   Bipolar disorder : depression ;continues to do well continue meds including lamictal, risperdal  no rash, no tremors  Alcohol use : denies recent use GAD;varies not worse. Continue zoloft   Insomnia: baseline. Continue trazadone  denies suicidal plan. Work in therapy for coping skills He may need letter in regard to bariatric surgery, he understands the risk and is doing well on meds Fu 49m . Questions addressed.   I discussed the assessment and treatment plan with the patient. The patient was provided an opportunity to ask questions and all were answered. The patient agreed with the plan and demonstrated an understanding of the instructions.   The patient was advised to call back or seek an in-person evaluation if the symptoms worsen or if the condition fails to improve as anticipated.  I provided 25  minutes of non-face-to-face time during this encounter.  Merian Capron, MD 11/25/20201:11 PM

## 2019-11-20 ENCOUNTER — Telehealth (HOSPITAL_COMMUNITY): Payer: Self-pay | Admitting: Psychiatry

## 2019-11-20 MED ORDER — BUPROPION HCL ER (XL) 150 MG PO TB24: 450.0000 mg | ORAL_TABLET | Freq: Every day | ORAL | 0 refills | Status: DC

## 2019-11-20 NOTE — Telephone Encounter (Signed)
Can you please send a refill to the [facility] for wellbutrin  He just wants to get them ordered so he has them on time.

## 2019-11-20 NOTE — Telephone Encounter (Signed)
sent 

## 2019-12-10 ENCOUNTER — Telehealth (HOSPITAL_COMMUNITY): Payer: Self-pay | Admitting: Psychiatry

## 2019-12-10 NOTE — Telephone Encounter (Signed)
Can continue twice a day. Patient to ask pharmacist if his prescriptions been ever twice a day in past or always once a day

## 2019-12-10 NOTE — Telephone Encounter (Signed)
Pt just noticed that he has been taking 2 risperdal a day. He noticed on the bottle it says 1 a day.   He feels great.  Is it ok to continue this, or should he go back to 1 a day? Please advise.

## 2019-12-10 NOTE — Telephone Encounter (Signed)
Lm for patient.  Nothing Further Needed at this time.

## 2019-12-17 ENCOUNTER — Telehealth (HOSPITAL_COMMUNITY): Payer: Self-pay | Admitting: Psychiatry

## 2019-12-17 MED ORDER — RISPERIDONE 3 MG PO TABS: 6.0000 mg | ORAL_TABLET | Freq: Every day | ORAL | 1 refills | Status: DC

## 2019-12-17 MED ORDER — BUPROPION HCL ER (XL) 150 MG PO TB24: 450.0000 mg | ORAL_TABLET | Freq: Every day | ORAL | 1 refills | Status: DC

## 2019-12-17 NOTE — Telephone Encounter (Signed)
Pt left a message.  He needs refills on all his meds, but not the trazadone.   Also remember from last week we upped his dose of risperidone. He is taking 2 a day.   [facility]   CB [phone]

## 2019-12-17 NOTE — Telephone Encounter (Signed)
Sent in refill on Wellbutrin and changed Risperidone to two tabs daily and sent to pharmacy as well. Patient has refills on all other medications. Patient notified.

## 2019-12-17 NOTE — Telephone Encounter (Signed)
Crystal can send

## 2020-02-01 ENCOUNTER — Encounter (HOSPITAL_COMMUNITY): Payer: Self-pay | Admitting: Psychiatry

## 2020-02-01 ENCOUNTER — Ambulatory Visit (INDEPENDENT_AMBULATORY_CARE_PROVIDER_SITE_OTHER): Payer: Medicare Other | Admitting: Psychiatry

## 2020-02-01 ENCOUNTER — Other Ambulatory Visit: Payer: Self-pay

## 2020-02-01 DIAGNOSIS — F3130 Bipolar disorder, current episode depressed, mild or moderate severity, unspecified: Secondary | ICD-10-CM

## 2020-02-01 DIAGNOSIS — F411 Generalized anxiety disorder: Secondary | ICD-10-CM | POA: Diagnosis not present

## 2020-02-01 DIAGNOSIS — F5102 Adjustment insomnia: Secondary | ICD-10-CM | POA: Diagnosis not present

## 2020-02-01 MED ORDER — BUPROPION HCL ER (XL) 150 MG PO TB24: 450.0000 mg | ORAL_TABLET | Freq: Every day | ORAL | 1 refills | Status: DC

## 2020-02-01 MED ORDER — TRAZODONE HCL 100 MG PO TABS: 100.0000 mg | ORAL_TABLET | Freq: Every day | ORAL | 2 refills | Status: DC

## 2020-02-01 MED ORDER — RISPERIDONE 3 MG PO TABS: 3.0000 mg | ORAL_TABLET | Freq: Two times a day (BID) | ORAL | 1 refills | Status: DC

## 2020-02-01 MED ORDER — SERTRALINE HCL 100 MG PO TABS: 250.0000 mg | ORAL_TABLET | Freq: Every day | ORAL | 2 refills | Status: DC

## 2020-02-01 MED ORDER — LAMOTRIGINE 200 MG PO TABS: 200.0000 mg | ORAL_TABLET | Freq: Two times a day (BID) | ORAL | 2 refills | Status: DC

## 2020-02-01 NOTE — Progress Notes (Signed)
[facility] Outpatient Follow up visit   Patient Identification: Kevin Savage MRN:  [id number] Date of Evaluation:  02/01/2020 Referral Source: [place] system Chief Complaint:   bipolar follow up  Visit Diagnosis:    ICD-10-CM   1. Bipolar I disorder, most recent episode depressed ([place])  F31.30   2. GAD (generalized anxiety disorder)  F41.1   3. Adjustment insomnia  F51.02     I connected with Ebenezer A Strawser on 02/01/20 at 12:00 PM EST by telephone and verified that I am speaking with the correct person using two identifiers.  I discussed the limitations, risks, security and privacy concerns of performing an evaluation and management service by telephone and the availability of in person appointments. I also discussed with the patient that there may be a patient responsible charge related to this service. The patient expressed understanding and agreed to proceed. 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   Last visit wanted letter for bariatric surgery, has been doing fair but their psych office had 150 questions he had to fill Felt best not have surgery as risk of depression He is ok with that and understands the risk of surgery  Mood wise fair, not hopeless  Has joined wellness program to loose weight but did not go well . Wife is supportive   No rash   Aggravating factor: step daugher in past, weight concerns, medical co morbidity  Stressful military service Pituitary adenoma Modifying factor: wife Duration : 18 years Associated Signs/Symptoms: No psychosis or paranoia  Past Psychiatric History: First hospitalization 1989 tried to hang himself while in [employer].  2004. Diagnosed with bipolar with psychotic features admitted in  hospital 2014 manic episode.      Past Medical History:  Past Medical History:  Diagnosis Date  . Gout   . High blood pressure   . Pituitary adenoma ([place])    History reviewed. No pertinent surgical history.    Family History: History reviewed. No pertinent family history.  Social History:   Social History   Socioeconomic History  . Marital status: Single    Spouse name: Not on file  . Number of children: Not on file  . Years of education: Not on file  . Highest education level: Not on file  Occupational History  . Not on file  Tobacco Use  . Smoking status: Never Smoker  . Smokeless tobacco: Never Used  Substance and Sexual Activity  . Alcohol use: No  . Drug use: No  . Sexual activity: Yes    Partners: Female  Other Topics Concern  . Not on file  Social History Narrative  . Not on file   Social Determinants of Health   Financial Resource Strain:   . Difficulty of Paying Living Expenses: Not on file  Food Insecurity:   . Worried About Charity fundraiser in the Last Year: Not on file  . Ran Out of Food in the Last Year: Not on file  Transportation Needs:   . Lack of Transportation (Medical): Not on file  . Lack of Transportation (Non-Medical): Not on file  Physical Activity:   . Days of Exercise per Week: Not on file  . Minutes of Exercise per Session: Not on  file  Stress:   . Feeling of Stress : Not on file  Social Connections:   . Frequency of Communication with Friends and Family: Not on file  . Frequency of Social Gatherings with Friends and Family: Not on file  . Attends Religious Services: Not on file  . Active Member of Clubs or Organizations: Not on file  . Attends Archivist Meetings: Not on file  . Marital Status: Not on file     Allergies:  No Known Allergies  Metabolic Disorder Labs: No results found for: HGBA1C, MPG No results found for: PROLACTIN No results found for: CHOL, TRIG, HDL, CHOLHDL, VLDL, LDLCALC   Current  Medications: Current Outpatient Medications  Medication Sig Dispense Refill  . allopurinol (ZYLOPRIM) 300 MG tablet Take 300 mg by mouth.    Marland Kitchen aspirin 81 MG chewable tablet Chew 81 mg by mouth.    Marland Kitchen buPROPion (WELLBUTRIN XL) 150 MG 24 hr tablet Take 3 tablets (450 mg total) by mouth daily. 90 tablet 1  . cabergoline (DOSTINEX) 0.5 MG tablet Take 0.25 mg by mouth.    . carvedilol (COREG) 6.25 MG tablet Take 6.25 mg by mouth.    . Cyanocobalamin (RA VITAMIN B-12 TR) 1000 MCG TBCR Take by mouth.    . diclofenac (CATAFLAM) 50 MG tablet Take 50 mg by mouth.    . DOCOSAHEXAENOIC ACID PO Take 1 g by mouth.    . fluticasone (FLONASE) 50 MCG/ACT nasal spray Place into the nose.    . furosemide (LASIX) 20 MG tablet Take 20 mg by mouth.    . lamoTRIgine (LAMICTAL) 200 MG tablet Take 1 tablet (200 mg total) by mouth 2 (two) times daily. 60 tablet 2  . loratadine (CLARITIN) 10 MG tablet Take 10 mg by mouth.    . Misc Natural Products (DANDELION ROOT PO) Take by mouth.    . omega-3 acid ethyl esters (LOVAZA) 1 g capsule Take by mouth daily.    Marland Kitchen omeprazole (PRILOSEC) 20 MG capsule Take 20 mg by mouth.    . risperiDONE (RISPERDAL) 3 MG tablet Take 1 tablet (3 mg total) by mouth 2 (two) times daily. 60 tablet 1  . rosuvastatin (CRESTOR) 20 MG tablet Take 10 mg by mouth.    . sertraline (ZOLOFT) 100 MG tablet Take 2.5 tablets (250 mg total) by mouth daily. 75 tablet 2  . testosterone enanthate (DELATESTRYL) 200 MG/ML injection Inject into the muscle.    . traZODone (DESYREL) 100 MG tablet Take 1 tablet (100 mg total) by mouth at bedtime. 30 tablet 2  . valsartan (DIOVAN) 160 MG tablet Take 160 mg by mouth.     No current facility-administered medications for this visit.      Psychiatric Specialty Exam: Review of Systems  Cardiovascular: Negative for chest pain.  Skin: Negative for rash.  Psychiatric/Behavioral: Negative for depression, substance abuse and suicidal ideas.    There were no vitals  taken for this visit.There is no height or weight on file to calculate BMI.  General Appearance:  Eye Contact:    Speech:  Normal Rate  Volume:  Normal  Mood: fair  Affect:  congruent  Thought Process:  Goal Directed  Orientation:  Full (Time, Place, and Person)  Thought Content:  Rumination  Suicidal Thoughts:  No  Homicidal Thoughts:  No  Memory:  Immediate;   Fair Recent;   Fair  Judgement:  Fair  Insight:  Shallow  Psychomotor Activity:  Normal  Concentration:  Concentration: Fair and  Attention Span: Fair  Recall:  [place]: Fair  Akathisia:  Negative  Handed:  Right  AIMS (if indicated):    Assets:  Desire for Improvement  ADL's:  Intact  Cognition: WNL  Sleep:  Fair on meds    Treatment Plan Summary: Medication management and Plan as follows   Bipolar disorder : depression ;fair, continue risperdal Alcohol use : denies recent use GAD; not worse, continue zoloft  Insomnia: fluctuates, reviewed sleep hygiene, continue trazadone   denies suicidal plan. Work in therapy for coping skills will refer Fu 22m.   I discussed the assessment and treatment plan with the patient. The patient was provided an opportunity to ask questions and all were answered. The patient agreed with the plan and demonstrated an understanding of the instructions.   The patient was advised to call back or seek an in-person evaluation if the symptoms worsen or if the condition fails to improve as anticipated.  I provided 25  minutes of non-face-to-face time during this encounter.  Merian Capron, MD 2/26/202112:09 PM

## 2020-02-26 ENCOUNTER — Ambulatory Visit (HOSPITAL_COMMUNITY): Payer: Non-veteran care | Admitting: Psychiatry

## 2020-03-03 ENCOUNTER — Ambulatory Visit (HOSPITAL_COMMUNITY): Payer: Medicare Other | Admitting: Licensed Clinical Social Worker

## 2020-03-04 ENCOUNTER — Ambulatory Visit (INDEPENDENT_AMBULATORY_CARE_PROVIDER_SITE_OTHER): Payer: Medicare Other | Admitting: Licensed Clinical Social Worker

## 2020-03-04 DIAGNOSIS — F411 Generalized anxiety disorder: Secondary | ICD-10-CM

## 2020-03-04 DIAGNOSIS — F5102 Adjustment insomnia: Secondary | ICD-10-CM

## 2020-03-04 DIAGNOSIS — F3130 Bipolar disorder, current episode depressed, mild or moderate severity, unspecified: Secondary | ICD-10-CM
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
## 2020-03-18 ENCOUNTER — Other Ambulatory Visit (HOSPITAL_COMMUNITY): Payer: Self-pay

## 2020-03-18 MED ORDER — RISPERIDONE 3 MG PO TABS: 3.0000 mg | ORAL_TABLET | Freq: Two times a day (BID) | ORAL | 1 refills | Status: DC

## 2020-03-18 MED ORDER — LAMOTRIGINE 200 MG PO TABS: 200.0000 mg | ORAL_TABLET | Freq: Two times a day (BID) | ORAL | 2 refills | Status: DC

## 2020-03-18 MED ORDER — SERTRALINE HCL 100 MG PO TABS: 250.0000 mg | ORAL_TABLET | Freq: Every day | ORAL | 2 refills | Status: DC

## 2020-03-18 MED ORDER — BUPROPION HCL ER (XL) 150 MG PO TB24: 450.0000 mg | ORAL_TABLET | Freq: Every day | ORAL | 1 refills | Status: DC

## 2020-03-25 ENCOUNTER — Telehealth (INDEPENDENT_AMBULATORY_CARE_PROVIDER_SITE_OTHER): Payer: Medicare Other | Admitting: Licensed Clinical Social Worker

## 2020-03-25 DIAGNOSIS — F3130 Bipolar disorder, current episode depressed, mild or moderate severity, unspecified: Secondary | ICD-10-CM

## 2020-03-25 DIAGNOSIS — F5102 Adjustment insomnia: Secondary | ICD-10-CM

## 2020-03-25 DIAGNOSIS — F411 Generalized anxiety disorder: Secondary | ICD-10-CM
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
## 2020-04-15 ENCOUNTER — Ambulatory Visit (HOSPITAL_COMMUNITY): Payer: Medicare Other | Admitting: Licensed Clinical Social Worker

## 2020-05-12 ENCOUNTER — Ambulatory Visit (HOSPITAL_COMMUNITY): Payer: Non-veteran care | Admitting: Licensed Clinical Social Worker

## 2020-05-14 ENCOUNTER — Ambulatory Visit (INDEPENDENT_AMBULATORY_CARE_PROVIDER_SITE_OTHER): Payer: Medicare Other | Admitting: Licensed Clinical Social Worker

## 2020-05-14 DIAGNOSIS — F411 Generalized anxiety disorder: Secondary | ICD-10-CM | POA: Diagnosis not present

## 2020-05-14 DIAGNOSIS — F5102 Adjustment insomnia: Secondary | ICD-10-CM | POA: Diagnosis not present

## 2020-05-14 DIAGNOSIS — F3130 Bipolar disorder, current episode depressed, mild or moderate severity, unspecified: Secondary | ICD-10-CM | POA: Diagnosis not present

## 2020-05-14 NOTE — Progress Notes (Addendum)
Virtual Visit via Telephone Note  Therapist-home, patient-home I connected with Kevin Savage on 05/14/20 at  3:00 PM EDT by telephone and verified that I am speaking with the correct person using two identifiers.   I discussed the limitations, risks, security and privacy concerns of performing an evaluation and management service by telephone and the availability of in person appointments. I also discussed with the patient that there may be a patient responsible charge related to this service. The patient expressed understanding and agreed to proceed.   I discussed the assessment and treatment plan with the patient. The patient was provided an opportunity to ask questions and all were answered. The patient agreed with the plan and demonstrated an understanding of the instructions.   The patient was advised to call back or seek an in-person evaluation if the symptoms worsen or if the condition fails to improve as anticipated.  I provided 54 minutes of non-face-to-face time during this encounter.  THERAPIST PROGRESS NOTE  Session Time: 3:00 PM to 3:54 PM  Participation Level: Active  Behavioral Response: CasualAlertAnxious, Dysphoric and Euthymic  Type of Therapy: Individual Therapy  Treatment Goals addressed: Decrease anxiety and depression, strengthen self-esteem, decrease trauma symptoms  Interventions: CBT, DBT, Solution Focused, Strength-based, Supportive, Reframing and Other: self-esteem, coping  Summary: Kevin Savage is a 54 y.o. male who presents with frustrated with traffic, panicked a little bit, came a different way, rushed to be back for this appointment, Doesn't do well if has to do too much at once. Been a little bit down. Situational depression. Stepdaughter she passed away 4 years ago end of May. May 18th would have been 14 and passed on 05/10/23. She passed when 10. Wife tells him little things and can't remember them. Remember things for a long time ago. Wants to talk to  doctor about medication for this. Has sticky's. On a good note daughter graduated from [employer] and started at [employer] working on the kidney floor and doing well. Proud, love her and good to him. Good to him and knows when having a good day and bad and will walk with him. Discussed how supports help [place] with coping. Discussed ways he is coping with grief. Doristine Bosworth came over three times. Wife online with families who have kids with cancer and kids that passed. She talks to patient, she has support from friends. Patient became emotional in discussing how fortunate he is to have his wife and therapist talked about realizing his own value. (see below) Patient shared his own quote, the important thing in life is life is not what you do but what you become. Explained what he thinks it means that you can get mixed up in things in world but what is important is what kind of person you turn out to be. It is from a [place] book is about walking with the [place]. How to finish the race. Tuesday morning goes to Bible study, buddy picks him up. Feels like going sometimes doesn't though sometimes.  Wife doesn't like him staying in the house too much. Her school will be out and they can do things. Wife wants him to confess and tell therapist that two weeks ago had a manic episode for three days. Spent $800 on internet and lost being able to have credit card. Discussed looking for warning signs Realize at end and not the beginning. In moment and so high don't notice. Can tell can be manic around the house. When a little bit around  the house. Wife points out guides him to breath and helps him to calm down. As reviewed warning signs he remembers seeing his friends and socializing, wife noticed this as precursor to manic episode. Normally doesn't like to socialize only with family. Therapist encouraged him to look for warning signs to help him in managing both manic and depressive episodes. Reviewed session and patient shares normally  doesn't like talking about feelings, feels better talking to therapist. Shared that Dr. De Nurse has told him he needs to sort his laundry meaning to do one thing at a time and discussed ways it applied to this session. Patient shared about looking forward to going to beach.        Suicidal/Homicidal: No  Therapist Response: Therapist reviewed symptoms, facilitated expression of thoughts and feelings utilize input from Dr. Nicole Cella about sorting his laundry to do 1 thing at a time to point in session how he was building his stress by going from 1 thing to another and wear that advice would be helpful to stay focused on 1 thing at a time.  Encourage patient to focus on problem solving in terms of memory and utilizing things to assist such as writing down what he needs to accomplish and provided positive feedback for proactive steps he is taking to do that.  Utilize self-esteem interventions by challenging patient's negative self statements explaining everyone has unconditional worth and pointing out qualities that he has that make people attracted to him and want to be with him, that he does not have to prove anything but like him for the qualities he does have.  Also so agreed with patient of having growth mindset and that we cannot be stagnant but work on her ourselves.  Explained why Mariel Kansky encouragement to get out of the house is actually a good strategy for depression that being more active helps with depression explain some of reason why includes getting his mind off of on helpful thoughts, increasing antidepressive chemicals in his brain, Motivating [place] to do more, making him feel less tired improving his ability to think more clearly, giving him a sense of achievement and enjoyment.  Discussed in general emotional regulation strategy of doing opposite action from what depression feels like.  Depression encourages him to retreat and isolate so opposite would be to engage in activity.  Utilize reframing in general  to encourage patient to focus on positive in his life such as enjoying time with his family, positive relationship with his daughter.  Discussed managing bipolar by paying more attention to relapse warning signs and helpful that his wife can help point some out for him to help him be more aware of them.  Therapist provided strength based and supportive intervention  Plan: Return again in 4 weeks.2.  Therapist work with patient on mood, anxiety, self-esteem  Diagnosis: Axis I: Bipolar I disorder, most recent episode depressed, Generalized Anxiety disorder, Adjustment Insomnia    Axis II: No diagnosis    Cordella Register, LCSW 05/14/2020

## 2020-06-10 ENCOUNTER — Telehealth (HOSPITAL_COMMUNITY): Payer: Self-pay

## 2020-06-10 ENCOUNTER — Other Ambulatory Visit (HOSPITAL_COMMUNITY): Payer: Self-pay

## 2020-06-10 MED ORDER — LAMOTRIGINE 200 MG PO TABS: 200.0000 mg | ORAL_TABLET | Freq: Two times a day (BID) | ORAL | 1 refills | Status: DC

## 2020-06-10 MED ORDER — RISPERIDONE 3 MG PO TABS: 3.0000 mg | ORAL_TABLET | Freq: Two times a day (BID) | ORAL | 1 refills | Status: DC

## 2020-06-10 MED ORDER — SERTRALINE HCL 100 MG PO TABS: 250.0000 mg | ORAL_TABLET | Freq: Every day | ORAL | 2 refills | Status: DC

## 2020-06-10 MED ORDER — BUPROPION HCL ER (XL) 150 MG PO TB24: 450.0000 mg | ORAL_TABLET | Freq: Every day | ORAL | 1 refills | Status: DC

## 2020-06-10 MED ORDER — DIPHENHYDRAMINE HCL 25 MG PO TABS: 12.5000 mg | ORAL_TABLET | Freq: Every evening | ORAL | 0 refills | Status: DC | PRN

## 2020-06-10 NOTE — Telephone Encounter (Signed)
I sent can take half of 25mg  prn for anxiety

## 2020-06-10 NOTE — Telephone Encounter (Signed)
Patient wants to know if you can give him something mild for his anxiety. He was looking for you to prescribe benadryl but I let him know that we don't prescribe that medication. He is using the [place] pharmacy.

## 2020-06-11 ENCOUNTER — Ambulatory Visit (INDEPENDENT_AMBULATORY_CARE_PROVIDER_SITE_OTHER): Payer: No Typology Code available for payment source | Admitting: Licensed Clinical Social Worker

## 2020-06-11 DIAGNOSIS — F3130 Bipolar disorder, current episode depressed, mild or moderate severity, unspecified: Secondary | ICD-10-CM | POA: Diagnosis not present

## 2020-06-11 DIAGNOSIS — F5102 Adjustment insomnia: Secondary | ICD-10-CM

## 2020-06-11 DIAGNOSIS — F411 Generalized anxiety disorder: Secondary | ICD-10-CM

## 2020-06-11 NOTE — Progress Notes (Signed)
Virtual Visit via Telephone Note  Therapist-office Patient-home I connected with Kevin Savage on 06/11/20 at  3:00 PM EDT by telephone and verified that I am speaking with the correct person using two identifiers.   I discussed the limitations, risks, security and privacy concerns of performing an evaluation and management service by telephone and the availability of in person appointments. I also discussed with the patient that there may be a patient responsible charge related to this service. The patient expressed understanding and agreed to proceed.   I discussed the assessment and treatment plan with the patient. The patient was provided an opportunity to ask questions and all were answered. The patient agreed with the plan and demonstrated an understanding of the instructions.   The patient was advised to call back or seek an in-person evaluation if the symptoms worsen or if the condition fails to improve as anticipated.  I provided 50 minutes of non-face-to-face time during this encounter.  THERAPIST PROGRESS NOTE  Session Time: 3:00 PM to 3:50 PM  Participation Level: Active  Behavioral Response: CasualAlertAnxious, Dysphoric and Euthymic  Type of Therapy: Individual Therapy  Treatment Goals addressed: Decrease anxiety and depression, strengthen self-esteem, decrease trauma symptoms  Interventions: Solution Focused, Strength-based, Supportive and Other: coping, self-esteem  Summary: Kevin Savage is a 54 y.o. male who presents with doing ok. Doing better this time of year wife out of school and she tries to keep him busy. Went to [employer] and decided doesn't want to go to beach anymore but rather go to mountains. July 26 has a surgery for skin cancer on his neck. Operate on it so doesn't grow. Thought about coming home a day early feels more secure at home, likes being at home better. Not so many people talking, didn't like being around a lot of people. Bothers his wife that patient  doesn't go as much as she would like. Worries about the traffic, other cars. Doesn't drive as much. Reviewed whether negative impact on patient to stay at home. He says it doesn't. If get wound up on his head, tune it to radio music seems to help with that. This time of year is good, watch Netflicks together or will drive her around going places. Mood feels pretty good. Medicine working thinks hasn't had bad thoughts or hearing voices. Only thing distracted by is racing thoughts. 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 Still get triggered by the news. Have seen stuff that wasn't good. Loved doing what he did. Doesn't know what had happened. Didn't know what bipolar was in 1989. Thought he was invincible. Thought he was hero. Didn't know it was chemical imbalance. Once knew what bipolar was "like sky parted and heavens opened up and then I know what was going on". As far as weight wife not as hard on him, needs to work on weight. Thinks she feels on eggshells if say one way react one way if say another way react another way.  Therapist asked patient to explain ways he can be hard on himself.  He feels strain on [employer] and government because of medications. Pays a little bit and feels better do his part. Goes to Bible study and enjoys spending time together with wife. Has a best friend, Mitzi Hansen. Patient brought up [facility] issues and [facility]. Discussed grounding and he does deep breathing.  Reviewed session and patient said helpful tries to remember things and apply things he remembers.  Shares more conscious  when he is getting down himself and therapist provided positive feedback for this.   Suicidal/Homicidal: No  Therapist Response: Therapist reviewed symptoms, facilitated expression of thoughts and feelings, continue to work on and provided positive feedback for patient becoming more conscious when he gets down on himself as it contributes to depression, therapist  continues to challenge this pointing out fundamental concept of self-esteem is that we will have unconditional worth now and is better worse than anybody else.  Explored reasons patient can be like this, for example paying for medication and therapist normalized this experience that everyone is they get older has to pay for medications for medical issues.  Reviewed patient's choices he is making which is best for him helping him to reflect so he can make healthy choices for himself.  Gust memory issues again encourage patient to get memory testing patient plans [facility].  Provided positive feedback for patient finding to things to do for on his own that include spending time with best friend discussed this is very helpful for mental health.  Plan: Return again in 4 weeks.2.Therapist work with patient on mood, anxiety, self-esteem  Diagnosis: Axis I:  Bipolar I disorder, most recent episode depressed, Generalized Anxiety disorder, Adjustment Insomnia    Axis II: No diagnosis    Cordella Register, LCSW 06/11/2020

## 2020-07-08 ENCOUNTER — Telehealth (HOSPITAL_COMMUNITY): Payer: Self-pay

## 2020-07-08 MED ORDER — BUSPIRONE HCL 7.5 MG PO TABS: 7.5000 mg | ORAL_TABLET | Freq: Every day | ORAL | 0 refills | Status: DC

## 2020-07-08 NOTE — Telephone Encounter (Signed)
[place] I sent buspar to pharmacy, should consider therapy, already on multiple medications Also need appointment soon 30 min follow up have not been seen for a while

## 2020-07-08 NOTE — Telephone Encounter (Signed)
Informed patient of new rx sent and made an appointment.

## 2020-07-08 NOTE — Telephone Encounter (Signed)
Patient wants to know if you can give him something for anxiety. He states the Benadryl is not working. Please advise.

## 2020-07-10 ENCOUNTER — Ambulatory Visit (HOSPITAL_COMMUNITY): Payer: Medicare Other | Admitting: Licensed Clinical Social Worker

## 2020-07-23 ENCOUNTER — Encounter (HOSPITAL_COMMUNITY): Payer: Self-pay | Admitting: Psychiatry

## 2020-07-23 ENCOUNTER — Telehealth (INDEPENDENT_AMBULATORY_CARE_PROVIDER_SITE_OTHER): Payer: No Typology Code available for payment source | Admitting: Psychiatry

## 2020-07-23 ENCOUNTER — Other Ambulatory Visit: Payer: Self-pay

## 2020-07-23 DIAGNOSIS — F411 Generalized anxiety disorder: Secondary | ICD-10-CM

## 2020-07-23 DIAGNOSIS — F3130 Bipolar disorder, current episode depressed, mild or moderate severity, unspecified: Secondary | ICD-10-CM

## 2020-07-23 DIAGNOSIS — F5102 Adjustment insomnia: Secondary | ICD-10-CM | POA: Diagnosis not present

## 2020-07-23 MED ORDER — BENZTROPINE MESYLATE 0.5 MG PO TABS: 0.5000 mg | ORAL_TABLET | Freq: Every day | ORAL | 0 refills | Status: DC

## 2020-07-23 NOTE — Progress Notes (Signed)
[facility] Outpatient Follow up visit   Patient Identification: Kevin Savage MRN:  [id number] Date of Evaluation:  07/23/2020 Referral Source: [place] system Chief Complaint:   bipolar follow up  Visit Diagnosis:    ICD-10-CM   1. Bipolar I disorder, most recent episode depressed ([place])  F31.30   2. GAD (generalized anxiety disorder)  F41.1   3. Adjustment insomnia  F51.02     I connected with Orrie A Popson on 07/23/20 at 10:30 AM EDT by a video enabled telemedicine application and verified that I am speaking with the correct person using two identifiers.     I discussed the limitations, risks, security and privacy concerns of performing an evaluation and management service by telephone and the availability of in person appointments. I also discussed with the patient that there may be a patient responsible charge related to this service. The patient expressed understanding and agreed to proceed.  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    buspar started after his phone call helps anxiety Noticing tremors when sitting down of his arms He does not want to lower risperdal says these meds are keeping him the best he is today   Mood wise fair, not hopeless  Has joined wellness program to loose weight but did not go well . Wife is supportive   No rash   Aggravating factor: step daugher in past, weight concerns, medical co morbidity  Stressful military service Pituitary adenoma Modifying factor: wife Duration : 20 years plus Associated Signs/Symptoms: No psychosis or paranoia  Past Psychiatric History: First hospitalization 1989 tried to hang himself while in [employer].  2004.  Diagnosed with bipolar with psychotic features admitted in hospital 2014 manic episode.      Past Medical History:  Past Medical History:  Diagnosis Date  . Gout   . High blood pressure   . Pituitary adenoma ([place])    History reviewed. No pertinent surgical history.    Family History: History reviewed. No pertinent family history.  Social History:   Social History   Socioeconomic History  . Marital status: Single    Spouse name: Not on file  . Number of children: Not on file  . Years of education: Not on file  . Highest education level: Not on file  Occupational History  . Not on file  Tobacco Use  . Smoking status: Never Smoker  . Smokeless tobacco: Never Used  Vaping Use  . Vaping Use: Never used  Substance and Sexual Activity  . Alcohol use: No  . Drug use: No  . Sexual activity: Yes    Partners: Female  Other Topics Concern  . Not on file  Social History Narrative  . Not on file   Social Determinants of Health   Financial Resource Strain:   . Difficulty of Paying Living Expenses:   Food Insecurity:   . Worried About Charity fundraiser in the Last Year:   . Arboriculturist in the Last Year:   Transportation Needs:   . Film/video editor (Medical):   Marland Kitchen Lack of Transportation (Non-Medical):   Physical Activity:   . Days of Exercise per Week:   .  Minutes of Exercise per Session:   Stress:   . Feeling of Stress :   Social Connections:   . Frequency of Communication with Friends and Family:   . Frequency of Social Gatherings with Friends and Family:   . Attends Religious Services:   . Active Member of Clubs or Organizations:   . Attends Archivist Meetings:   Marland Kitchen Marital Status:      Allergies:  No Known Allergies  Metabolic Disorder Labs: No results found for: HGBA1C, MPG No results found for: PROLACTIN No results found for: CHOL, TRIG, HDL, CHOLHDL, VLDL, LDLCALC   Current Medications: Current Outpatient Medications   Medication Sig Dispense Refill  . allopurinol (ZYLOPRIM) 300 MG tablet Take 300 mg by mouth.    Marland Kitchen aspirin 81 MG chewable tablet Chew 81 mg by mouth.    . benztropine (COGENTIN) 0.5 MG tablet Take 1 tablet (0.5 mg total) by mouth daily. 30 tablet 0  . buPROPion (WELLBUTRIN XL) 150 MG 24 hr tablet Take 3 tablets (450 mg total) by mouth daily. 90 tablet 1  . busPIRone (BUSPAR) 7.5 MG tablet Take 1 tablet (7.5 mg total) by mouth daily. 30 tablet 0  . cabergoline (DOSTINEX) 0.5 MG tablet Take 0.25 mg by mouth.    . carvedilol (COREG) 6.25 MG tablet Take 6.25 mg by mouth.    . Cyanocobalamin (RA VITAMIN B-12 TR) 1000 MCG TBCR Take by mouth.    . diclofenac (CATAFLAM) 50 MG tablet Take 50 mg by mouth.    . DOCOSAHEXAENOIC ACID PO Take 1 g by mouth.    . fluticasone (FLONASE) 50 MCG/ACT nasal spray Place into the nose.    . furosemide (LASIX) 20 MG tablet Take 20 mg by mouth.    . lamoTRIgine (LAMICTAL) 200 MG tablet Take 1 tablet (200 mg total) by mouth 2 (two) times daily. 60 tablet 1  . loratadine (CLARITIN) 10 MG tablet Take 10 mg by mouth.    . Misc Natural Products (DANDELION ROOT PO) Take by mouth.    . omega-3 acid ethyl esters (LOVAZA) 1 g capsule Take by mouth daily.    Marland Kitchen omeprazole (PRILOSEC) 20 MG capsule Take 20 mg by mouth.    . risperiDONE (RISPERDAL) 3 MG tablet Take 1 tablet (3 mg total) by mouth 2 (two) times daily. 60 tablet 1  . rosuvastatin (CRESTOR) 20 MG tablet Take 10 mg by mouth.    . sertraline (ZOLOFT) 100 MG tablet Take 2.5 tablets (250 mg total) by mouth daily. 75 tablet 2  . testosterone enanthate (DELATESTRYL) 200 MG/ML injection Inject into the muscle.    . valsartan (DIOVAN) 160 MG tablet Take 160 mg by mouth.     No current facility-administered medications for this visit.      Psychiatric Specialty Exam: Review of Systems  Cardiovascular: Negative for chest pain.  Skin: Negative for rash.  Neurological: Positive for tremors.  Psychiatric/Behavioral:  Negative for depression, substance abuse and suicidal ideas.    There were no vitals taken for this visit.There is no height or weight on file to calculate BMI.  General Appearance:  Eye Contact:    Speech:  Normal Rate  Volume:  Normal  Mood: fair  Affect:  congruent  Thought Process:  Goal Directed  Orientation:  Full (Time, Place, and Person)  Thought Content:  Rumination  Suicidal Thoughts:  No  Homicidal Thoughts:  No  Memory:  Immediate;   Fair Recent;   Fair  Judgement:  Fair  Insight:  Shallow  Psychomotor Activity:  Normal  Concentration:  Concentration: Fair and Attention Span: Fair  Recall:  [employer] of Knowledge:Fair  Language: Fair  Akathisia:  Negative  Handed:  Right  AIMS (if indicated):    Assets:  Desire for Improvement  ADL's:  Intact  Cognition: WNL  Sleep:  Fair on meds    Treatment Plan Summary: Medication management and Plan as follows   Bipolar disorder : depression  Fair, continue risperdal, lamictal. Has tremors does not want to lower riseprdal Will add cogentin he agrees to low dose and see its effect  Alcohol use : denies recent use GAD; not worse, continue zoloft Insomnia: better, takes buspar and not on trazadone or vistaril. Work on sleep hygiene  Fu 6w.   I discussed the assessment and treatment plan with the patient. The patient was provided an opportunity to ask questions and all were answered. The patient agreed with the plan and demonstrated an understanding of the instructions.   The patient was advised to call back or seek an in-person evaluation if the symptoms worsen or if the condition fails to improve as anticipated.  I provided 20 minutes of non-face-to-face time during this encounter.  Merian Capron, MD 8/18/202110:50 AM

## 2020-07-31 ENCOUNTER — Other Ambulatory Visit (HOSPITAL_COMMUNITY): Payer: Self-pay

## 2020-07-31 ENCOUNTER — Telehealth (HOSPITAL_COMMUNITY): Payer: Self-pay | Admitting: Psychiatry

## 2020-07-31 MED ORDER — BENZTROPINE MESYLATE 0.5 MG PO TABS: 0.5000 mg | ORAL_TABLET | Freq: Two times a day (BID) | ORAL | 0 refills | Status: DC

## 2020-07-31 MED ORDER — BUSPIRONE HCL 7.5 MG PO TABS: 7.5000 mg | ORAL_TABLET | Freq: Every day | ORAL | 2 refills | Status: DC

## 2020-07-31 MED ORDER — BUSPIRONE HCL 7.5 MG PO TABS: 7.5000 mg | ORAL_TABLET | Freq: Every day | ORAL | 0 refills | Status: DC

## 2020-07-31 NOTE — Telephone Encounter (Signed)
Informed patient of the following. Shows understanding. Nothing Further Needed at this time.

## 2020-07-31 NOTE — Telephone Encounter (Signed)
He can increase cogentin to twice a day, I have sent 60 tabs refill he may need earlier

## 2020-07-31 NOTE — Telephone Encounter (Signed)
Pt was given a low dose of something for his tremors. He would like to increase the medication because it is not helping.   Please advise.  CB [phone]

## 2020-08-13 ENCOUNTER — Telehealth (HOSPITAL_COMMUNITY): Payer: Self-pay | Admitting: Psychiatry

## 2020-08-13 NOTE — Telephone Encounter (Signed)
Pt is on cogentin Lately he has had extreme dry mouth. He would like to know if this is the cause or a side effect.   cb [phone]

## 2020-08-14 NOTE — Telephone Encounter (Signed)
Spoke to patient.  Showed understanding.  Nothing Further Needed at this time.

## 2020-08-14 NOTE — Telephone Encounter (Signed)
Yes it can be also with other meds, can cut cogentin to half of current dose . Also use cpap machine if not using with humidification

## 2020-08-14 NOTE — Telephone Encounter (Signed)
Lm for patient to return call.

## 2020-08-21 ENCOUNTER — Other Ambulatory Visit (HOSPITAL_COMMUNITY): Payer: Self-pay

## 2020-08-21 MED ORDER — LAMOTRIGINE 200 MG PO TABS
200.0000 mg | ORAL_TABLET | Freq: Two times a day (BID) | ORAL | 1 refills | Status: DC
Start: 2020-08-21 — End: 2020-11-03

## 2020-08-21 MED ORDER — RISPERIDONE 3 MG PO TABS: 3.0000 mg | ORAL_TABLET | Freq: Two times a day (BID) | ORAL | 1 refills | Status: DC

## 2020-08-21 MED ORDER — BUPROPION HCL ER (XL) 150 MG PO TB24
450.0000 mg | ORAL_TABLET | Freq: Every day | ORAL | 1 refills | Status: DC
Start: 2020-08-21 — End: 2020-11-03

## 2020-08-25 ENCOUNTER — Ambulatory Visit (HOSPITAL_COMMUNITY): Payer: No Typology Code available for payment source | Admitting: Licensed Clinical Social Worker

## 2020-08-29 ENCOUNTER — Ambulatory Visit (INDEPENDENT_AMBULATORY_CARE_PROVIDER_SITE_OTHER): Payer: No Typology Code available for payment source | Admitting: Licensed Clinical Social Worker

## 2020-08-29 ENCOUNTER — Telehealth (HOSPITAL_COMMUNITY): Payer: Self-pay | Admitting: Licensed Clinical Social Worker

## 2020-08-29 DIAGNOSIS — F411 Generalized anxiety disorder: Secondary | ICD-10-CM

## 2020-08-29 DIAGNOSIS — F3130 Bipolar disorder, current episode depressed, mild or moderate severity, unspecified: Secondary | ICD-10-CM

## 2020-08-29 DIAGNOSIS — F5102 Adjustment insomnia: Secondary | ICD-10-CM

## 2020-08-29 NOTE — Progress Notes (Signed)
Virtual Visit via Telephone Note  Therapist-home office Patient-home I connected with Kire A Deane on 08/29/20 at 11:00 AM EDT by telephone and verified that I am speaking with the correct person using two identifiers.   I discussed the limitations, risks, security and privacy concerns of performing an evaluation and management service by telephone and the availability of in person appointments. I also discussed with the patient that there may be a patient responsible charge related to this service. The patient expressed understanding and agreed to proceed.   I discussed the assessment and treatment plan with the patient. The patient was provided an opportunity to ask questions and all were answered. The patient agreed with the plan and demonstrated an understanding of the instructions.   The patient was advised to call back or seek an in-person evaluation if the symptoms worsen or if the condition fails to improve as anticipated.  I provided 45 minutes of non-face-to-face time during this encounter.  THERAPIST PROGRESS NOTE  Session Time: 11:00 AM to 11:45 AM  Participation Level: Active  Behavioral Response: CasualAlertAnxious and Dysphoric  Type of Therapy: Individual Therapy  Treatment Goals addressed:  Decrease anxiety and depression, strengthen self-esteem, decrease trauma symptoms Interventions: Solution Focused, Strength-based, Reframing and Other: self-compassion, coping  Summary: Tyreece A Claros is a 54 y.o. male who presents with day not starting off well. Overslept and not taking medicine. Down on himself rely on so many people and can't be good for them. Can't work. Not fair. Not contributing. Wife makes a list and he gets through it and sometimes he can't. Wants to be like everybody else. Tries to be the best person but also has a voice that says that is not worth anything. Was doing so good and felt so good wanted to stop the medicine. Wife said that he can't. Wants to do  what everybody else does and wake up, go to work and take care of his family instead of everybody taking care of him.    Can't remember stuff, out in public and worry about what people think.  People say negative things about him and therapist inquired what kind of things.  Patient says he hears he should not be there.  Therapist question whether everybody thinks that the kind of people do say that her jerks.  Perhaps patient focuses in on that because of his own internal negative voice.  Doctor recommending memory test so will be going for that.  Reviewed session and patient said need when he is feeling overwhelmed and starts negative thinking to review the meditation and get back to Center get back to the here now need get to that place overhewlmed, negative thinking meditation and back to center here and now and think about what he can do now not think about the future or the past.  Reviewing exercise of what he would tell his friends said he would tell his friend he could help it and part of disease with forgetting medication and not being able to work.  Explored ways family appreciates him patient also recognize things he is forcing about.  Pointed out for example he goes to watch his daughter ride horses and how she appreciates him for that.  Patient shares being reminded of what his mother said before she died that bipolar is like a heart condition and therapist agreed it is a physical consideration in this case in the brain.  Suicidal/Homicidal: No  Therapist Response: Therapist reviewed symptoms, facilitated expression of thoughts and feelings,  noted patient's negative voice that was an accurate, not helpful for functioning.  It would lead to negative thought train that would lead to negative emotions.  Discussed very helpful for functioning to have a self compassionate voice and a better relationship with self helpful for mental health symptoms and that everybody needs to develop this type of voice to  care about and take care of oneself and value oneself.  Therapist related could develop despite thinking about how he would treat his best friend.  Therapist challenged patient on his relying on everybody all the time and ways he contributes to the family, the ways family appreciates him.  Provided psychoeducation on bipolar recognizing it is a medical disease for patient to have more accurate concept of how he is impacted.  Therapist did self compassion meditation saying self compassionate voice is when we have to develop.  Provided strength based, supportive, active listening open questions  Plan: Return again in 4 weeks.2.Patient look for compassionate exercises and therapist will look and do one with patient for next session. 3..Therapist work with patient on mood, anxiety, self-esteem  Diagnosis: Axis I:  Bipolar I disorder, most recent episode depressed, Generalized Anxiety disorder, Adjustment Insomnia    Axis II: No diagnosis    Cordella Register, LCSW 08/29/2020

## 2020-08-29 NOTE — Telephone Encounter (Signed)
Na

## 2020-09-04 ENCOUNTER — Encounter (HOSPITAL_COMMUNITY): Payer: Self-pay | Admitting: Psychiatry

## 2020-09-04 ENCOUNTER — Telehealth (INDEPENDENT_AMBULATORY_CARE_PROVIDER_SITE_OTHER): Payer: No Typology Code available for payment source | Admitting: Psychiatry

## 2020-09-04 DIAGNOSIS — F5102 Adjustment insomnia: Secondary | ICD-10-CM | POA: Diagnosis not present

## 2020-09-04 DIAGNOSIS — F411 Generalized anxiety disorder: Secondary | ICD-10-CM | POA: Diagnosis not present

## 2020-09-04 DIAGNOSIS — F3130 Bipolar disorder, current episode depressed, mild or moderate severity, unspecified: Secondary | ICD-10-CM

## 2020-09-04 MED ORDER — SERTRALINE HCL 100 MG PO TABS
250.0000 mg | ORAL_TABLET | Freq: Every day | ORAL | 2 refills | Status: DC
Start: 2020-09-04 — End: 2020-12-29

## 2020-09-04 MED ORDER — BENZTROPINE MESYLATE 0.5 MG PO TABS: 0.5000 mg | ORAL_TABLET | Freq: Two times a day (BID) | ORAL | 2 refills | Status: DC

## 2020-09-04 NOTE — Progress Notes (Signed)
[facility] Outpatient Follow up visit   Patient Identification: Kevin Savage MRN:  [id number] Date of Evaluation:  09/04/2020 Referral Source: [place] system Chief Complaint:   bipolar follow up  Visit Diagnosis:    ICD-10-CM   1. Bipolar I disorder, most recent episode depressed ([place])  F31.30   2. GAD (generalized anxiety disorder)  F41.1   3. Adjustment insomnia  F51.02      I connected with Abeer A Riggin on 09/04/20 at 10:30 AM EDT by telephone and verified that I am speaking with the correct person using two identifiers.     I discussed the limitations, risks, security and privacy concerns of performing an evaluation and management service by telephone and the availability of in person appointments. I also discussed with the patient that there may be a patient responsible charge related to this service. The patient expressed understanding and agreed to proceed.  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    Doing fair, therapy has helped to cope with symptoms Adding cogentin has helped tremors and did not endorse them buspar helping anxiety Wife is supportive    Mood wise fair, not hopeless  Has joined wellness program to loose weight but did not go well . Wife is supportive  Tries to keep busy . Lamictal, risperdal helps mood balance No rash   Aggravating factor: step daughter in past, weight concerns, medical co morbidity  Stressful military service Pituitary adenoma Modifying factor: wife Duration : 20 plus years Associated Signs/Symptoms: No psychosis or paranoia  Past Psychiatric History: First hospitalization 1989 tried to hang himself while in [employer].  2004.  Diagnosed with bipolar with psychotic features admitted in hospital 2014 manic episode.      Past Medical History:  Past Medical History:  Diagnosis Date  . Gout   . High blood pressure   . Pituitary adenoma ([place])    History reviewed. No pertinent surgical history.    Family History: History reviewed. No pertinent family history.  Social History:   Social History   Socioeconomic History  . Marital status: Single    Spouse name: Not on file  . Number of children: Not on file  . Years of education: Not on file  . Highest education level: Not on file  Occupational History  . Not on file  Tobacco Use  . Smoking status: Never Smoker  . Smokeless tobacco: Never Used  Vaping Use  . Vaping Use: Never used  Substance and Sexual Activity  . Alcohol use: No  . Drug use: No  . Sexual activity: Yes    Partners: Female  Other Topics Concern  . Not on file  Social History Narrative  . Not on file   Social Determinants of Health   Financial Resource Strain:   . Difficulty of Paying Living Expenses: Not on file  Food Insecurity:   . Worried About Charity fundraiser in the Last Year: Not on file  . Ran Out of Food in the Last Year: Not on file  Transportation Needs:   . Lack of Transportation (Medical): Not on file  . Lack of Transportation (Non-Medical): Not on file  Physical Activity:   .  Days of Exercise per Week: Not on file  . Minutes of Exercise per Session: Not on file  Stress:   . Feeling of Stress : Not on file  Social Connections:   . Frequency of Communication with Friends and Family: Not on file  . Frequency of Social Gatherings with Friends and Family: Not on file  . Attends Religious Services: Not on file  . Active Member of Clubs or Organizations: Not on file  . Attends Archivist Meetings: Not on file  . Marital Status: Not on file     Allergies:  No Known Allergies  Metabolic Disorder Labs: No results found for: HGBA1C, MPG No results  found for: PROLACTIN No results found for: CHOL, TRIG, HDL, CHOLHDL, VLDL, LDLCALC   Current Medications: Current Outpatient Medications  Medication Sig Dispense Refill  . allopurinol (ZYLOPRIM) 300 MG tablet Take 300 mg by mouth.    Marland Kitchen aspirin 81 MG chewable tablet Chew 81 mg by mouth.    . benztropine (COGENTIN) 0.5 MG tablet Take 1 tablet (0.5 mg total) by mouth 2 (two) times daily. 60 tablet 2  . buPROPion (WELLBUTRIN XL) 150 MG 24 hr tablet Take 3 tablets (450 mg total) by mouth daily. 90 tablet 1  . busPIRone (BUSPAR) 7.5 MG tablet Take 1 tablet (7.5 mg total) by mouth daily. 30 tablet 2  . cabergoline (DOSTINEX) 0.5 MG tablet Take 0.25 mg by mouth.    . carvedilol (COREG) 6.25 MG tablet Take 6.25 mg by mouth.    . Cyanocobalamin (RA VITAMIN B-12 TR) 1000 MCG TBCR Take by mouth.    . diclofenac (CATAFLAM) 50 MG tablet Take 50 mg by mouth.    . DOCOSAHEXAENOIC ACID PO Take 1 g by mouth.    . fluticasone (FLONASE) 50 MCG/ACT nasal spray Place into the nose.    . furosemide (LASIX) 20 MG tablet Take 20 mg by mouth.    . lamoTRIgine (LAMICTAL) 200 MG tablet Take 1 tablet (200 mg total) by mouth 2 (two) times daily. 60 tablet 1  . loratadine (CLARITIN) 10 MG tablet Take 10 mg by mouth.    . Misc Natural Products (DANDELION ROOT PO) Take by mouth.    . omega-3 acid ethyl esters (LOVAZA) 1 g capsule Take by mouth daily.    Marland Kitchen omeprazole (PRILOSEC) 20 MG capsule Take 20 mg by mouth.    . risperiDONE (RISPERDAL) 3 MG tablet Take 1 tablet (3 mg total) by mouth 2 (two) times daily. 60 tablet 1  . rosuvastatin (CRESTOR) 20 MG tablet Take 10 mg by mouth.    . sertraline (ZOLOFT) 100 MG tablet Take 2.5 tablets (250 mg total) by mouth daily. 75 tablet 2  . testosterone enanthate (DELATESTRYL) 200 MG/ML injection Inject into the muscle.    . valsartan (DIOVAN) 160 MG tablet Take 160 mg by mouth.     No current facility-administered medications for this visit.      Psychiatric Specialty  Exam: Review of Systems  Cardiovascular: Negative for chest pain.  Skin: Negative for rash.  Psychiatric/Behavioral: Negative for depression, substance abuse and suicidal ideas.    There were no vitals taken for this visit.There is no height or weight on file to calculate BMI.  General Appearance:  Eye Contact:    Speech:  Normal Rate  Volume:  Normal  Mood: fair  Affect:  congruent  Thought Process:  Goal Directed  Orientation:  Full (Time, Place, and Person)  Thought Content:  Rumination  Suicidal  Thoughts:  No  Homicidal Thoughts:  No  Memory:  Immediate;   Fair Recent;   Fair  Judgement:  Fair  Insight:  Shallow  Psychomotor Activity:  Normal  Concentration:  Concentration: Fair and Attention Span: Fair  Recall:  [employer] of Knowledge:Fair  Language: Fair  Akathisia:  Negative  Handed:  Right  AIMS (if indicated):    Assets:  Desire for Improvement  ADL's:  Intact  Cognition: WNL  Sleep:  Fair on meds    Treatment Plan Summary: Medication management and Plan as follows   Bipolar disorder : depression ; manageable, continue risperdal, lamictal, cogentin to prevent tremors Alcohol use : denies recent use GAD; managealbe on zoloft and therapy is helping Insomnia: reviewed sleep hygiene . risperdal helping with sleep as well.  I discussed the assessment and treatment plan with the patient. The patient was provided an opportunity to ask questions and all were answered. The patient agreed with the plan and demonstrated an understanding of the instructions.   The patient was advised to call back or seek an in-person evaluation if the symptoms worsen or if the condition fails to improve as anticipated. Fu 2-15m or earlier if needed I provided 20 minutes of non-face-to-face time during this encounter.  Merian Capron, MD 9/30/202110:48 AM

## 2020-10-10 ENCOUNTER — Ambulatory Visit (HOSPITAL_COMMUNITY): Payer: Medicare Other | Admitting: Licensed Clinical Social Worker

## 2020-10-23 ENCOUNTER — Ambulatory Visit (INDEPENDENT_AMBULATORY_CARE_PROVIDER_SITE_OTHER): Payer: Medicare Other | Admitting: Licensed Clinical Social Worker

## 2020-10-23 DIAGNOSIS — F411 Generalized anxiety disorder: Secondary | ICD-10-CM

## 2020-10-23 DIAGNOSIS — F3130 Bipolar disorder, current episode depressed, mild or moderate severity, unspecified: Secondary | ICD-10-CM

## 2020-10-23 DIAGNOSIS — F5102 Adjustment insomnia: Secondary | ICD-10-CM

## 2020-10-23 NOTE — Progress Notes (Signed)
Virtual Visit via Telephone Note  I connected with Kevin Savage on 10/23/20 at 11:00 AM EST by telephone and verified that I am speaking with the correct person using two identifiers.  Location: Patient: home Provider: office   I discussed the limitations, risks, security and privacy concerns of performing an evaluation and management service by telephone and the availability of in person appointments. I also discussed with the patient that there may be a patient responsible charge related to this service. The patient expressed understanding and agreed to proceed.   I discussed the assessment and treatment plan with the patient. The patient was provided an opportunity to ask questions and all were answered. The patient agreed with the plan and demonstrated an understanding of the instructions.   The patient was advised to call back or seek an in-person evaluation if the symptoms worsen or if the condition fails to improve as anticipated.  I provided 53 minutes of non-face-to-face time during this encounter.  THERAPIST PROGRESS NOTE  Session Time: 11:00 AM to 11:53 AM  Participation Level: Active  Behavioral Response: CasualAlertEuthymic  Type of Therapy: Individual Therapy  Treatment Goals addressed:  Decrease anxiety and depression, strengthen self-esteem, decrease trauma symptoms  Interventions: Solution Focused, Strength-based, Supportive and Other: coping  Summary: Kevin Savage (People call him that Bump-first two words that he spoke) is a 54 y.o. male who presents with doing ok and jokingly said "haven't heard people not there, or saw people that is a good day." Discussed doing things during the day enjoys. He has a list wife left him and likes that. Memory is not good concerned about that. Knows he has tendency to worry. Grandmother had Alzheimer's. Going to [place] and will ask PCP. Takes it one day at a time, "sort laundry" don't bog down by everything should be and doing. "take a  little bit". Therapeutic to be around animals, talks to them. For wife is her therapy to be with horses. Discussed they pulled each other through things doing the best they can, she help pull him through his issues with bipolar. Says he knows he is "blessed" and sees could be vet on the streets. Say one thing to [place] and cut benefits one way he contributes to the household. Make money patient says but don't think a good husband. Shared the positive news that he lost 53 lbs since August and 14 more to go to be 300 lbs. Goal 250 lbs by next August. Diet called [employer]. Helped their relationship. To lose weight. Was too stagnate for awhile.  Therapist pointed out was nice to hear this news to celebrate with patient as 1 he first started with her weight was an issue and nice to see him to have success and have achievement with [place].  Realizes has to take things day by day. Can't tell if manic. Tries to grasp of it so prepared more he tries to do this more he realizes he has no control over it.  Should notes he feels a little manicky today talking more and therapist notes this as well.  Shared one of the problems is Trazodone and feels "wrung out" and in a fog.  Therapist reviewed record and he is no longer on it but taking Risperdal brainstormed on other strategies patient could do in will talk to Dr. About sleeping issues.  Also has had anxiety. Went to Christmas thing and too many people went to the car. Gave him medicine Buspar helping some.  Maybe ask to  take BuSpar at night to help him sleep.  Patient shared about losing younger daughter when she was 32 to cancer, devastating for his wife as it was her daughter from a previous marriage discussed ways patient was there for her to lean on, discussed the benefit of just "sitting with her".  These histories therapist guided patient realizing ways he had positive qualities.  Assess helpful for patient to realize other people known can help guide him when they start to  notice he becomes more manicky even when he can notice it himself.     Suicidal/Homicidal: No  Therapist Response: Therapist reviewed symptoms, facilitated expression of thoughts and feelings noted patient can be negative about himself and yet pointed out all the positive things that were talked about him during conversation to show many positive qualities.  Things like helping his wife out at different times when she lost her daughter, regularly helping out of the house being a good support to his wife.  Reinforced positive strategy of managing anxiety by "sorting his laundry" meaning not to take out everything at once and overwhelm himself also encourage patient with philosophy of taking things day by day.  Noted patient achieving goal of losing weight and pointed out how that helps mood to make and reach goals.  Noted issues with sleep reviewed some strategies for patient plan to talk to Dr. Additionally with memory issues patient will also talk to Dr. About testing.  Therapist provided active listening open questions supportive interventions  Plan: Return again 3 weeks 2.Therapist work with patient on mood, anxiety, self-esteem  Diagnosis: Axis I:   Bipolar I disorder, most recent episode depressed, Generalized Anxiety disorder, Adjustment Insomnia    Axis II: No diagnosis    Cordella Register, LCSW 10/23/2020

## 2020-11-03 ENCOUNTER — Other Ambulatory Visit (HOSPITAL_COMMUNITY): Payer: Self-pay

## 2020-11-03 MED ORDER — BUPROPION HCL ER (XL) 150 MG PO TB24
450.0000 mg | ORAL_TABLET | Freq: Every day | ORAL | 1 refills | Status: DC
Start: 2020-11-03 — End: 2020-12-31

## 2020-11-03 MED ORDER — RISPERIDONE 3 MG PO TABS: 3.0000 mg | ORAL_TABLET | Freq: Two times a day (BID) | ORAL | 1 refills | Status: DC

## 2020-11-03 MED ORDER — LAMOTRIGINE 200 MG PO TABS: 200.0000 mg | ORAL_TABLET | Freq: Two times a day (BID) | ORAL | 1 refills | Status: DC

## 2020-11-10 ENCOUNTER — Other Ambulatory Visit (HOSPITAL_COMMUNITY): Payer: Self-pay

## 2020-11-10 ENCOUNTER — Ambulatory Visit (INDEPENDENT_AMBULATORY_CARE_PROVIDER_SITE_OTHER): Payer: Medicare Other | Admitting: Licensed Clinical Social Worker

## 2020-11-10 DIAGNOSIS — F411 Generalized anxiety disorder: Secondary | ICD-10-CM

## 2020-11-10 DIAGNOSIS — F5102 Adjustment insomnia: Secondary | ICD-10-CM | POA: Diagnosis not present

## 2020-11-10 DIAGNOSIS — F3130 Bipolar disorder, current episode depressed, mild or moderate severity, unspecified: Secondary | ICD-10-CM | POA: Diagnosis not present

## 2020-11-10 MED ORDER — BUSPIRONE HCL 7.5 MG PO TABS
7.5000 mg | ORAL_TABLET | Freq: Every day | ORAL | 2 refills | Status: DC
Start: 2020-11-10 — End: 2020-12-16

## 2020-11-10 NOTE — Progress Notes (Signed)
Virtual Visit via Telephone Note  I connected with Kevin Savage on 11/10/20 at 11:00 AM EST by telephone and verified that I am speaking with the correct person using two identifiers.  Location: Patient: home  Provider: home office   I discussed the limitations, risks, security and privacy concerns of performing an evaluation and management service by telephone and the availability of in person appointments. I also discussed with the patient that there may be a patient responsible charge related to this service. The patient expressed understanding and agreed to proceed.   I discussed the assessment and treatment plan with the patient. The patient was provided an opportunity to ask questions and all were answered. The patient agreed with the plan and demonstrated an understanding of the instructions.   The patient was advised to call back or seek an in-person evaluation if the symptoms worsen or if the condition fails to improve as anticipated.  I provided 55 minutes of non-face-to-face time during this encounter.  THERAPIST PROGRESS NOTE  Session Time: 11:00 AM to 11:55 AM  Participation Level: Active  Behavioral Response: CasualAlertAnxious and Euthymic  Type of Therapy: Individual Therapy  Treatment Goals addressed:  Decrease anxiety and depression, strengthen self-esteem, decrease trauma symptoms Interventions: Solution Focused, Strength-based, Supportive, Reframing and Other: coping  Summary: Kevin Savage is a 54 y.o. male who presents with at right time about to have a meltdown. Daughter hit a deer. 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 Feeling out of sorts not used to handling a lot of things. Talked to a guy knows at church will look at it sooner. Took anxiety pill. Talked about one of his favorite things to do is go to [employer]. List for him to do some days gets him done and sometimes doesn't. Get fulfillment when finishes the list. Shared how he has diagnosis of mental  health but she needs his support that she leans on him when gets upset about [place], "you can imagine losing a child". Almost run in with neighbor and patient shares if he is upset blows his top, gets upset and doesn't want a feud with neighbor. Neighbor Cusses like crazy. Already said something to him.  Therapist talked about approaching people in a manner that is not threatening angle that people be less defensive can be used, noting neighbor has a habit that developed it needs reminding to think about impact of cussing on those around him. Patient  discussed manic feelings that he can release with therapist, says to manage he sits in chair reading the Bible, slow jazz calm him, music does a lot of good for him. Discussed impact of Bible and patient says  "Bible is a play list of life."      Suicidal/Homicidal: No  Therapist Response: Therapist reviewed symptoms, facilitated expression of thoughts and feelings in provided reframing for patient stress that he is doing very well in managing problem he is working on taking care of as far as his daughter's car.  Discussed coping in general for when feels panic discussed helpful coping strategies of reading the Bible, listening to music.  Discussed helps to have motivation to feel good and finishing the list but at the same time assess helpful patient does not put too much pressure on himself.  Focused on patient's strengths for the session such as speaking up about things, caring about helping his family, being there to help his family with significant issues.  Therapist provided active listening, open questions supportive interventions  Plan: Return again in 3 weeks.2.Therapist work with patient on mood, anxiety, self-esteem  Diagnosis: Axis I:  Bipolar I disorder, most recent episode depressed, Generalized Anxiety disorder, Adjustment Insomnia    Axis II: No diagnosis    Cordella Register, LCSW 11/10/2020

## 2020-12-04 ENCOUNTER — Ambulatory Visit (HOSPITAL_COMMUNITY): Payer: Medicare Other | Admitting: Licensed Clinical Social Worker

## 2020-12-04 NOTE — Progress Notes (Signed)
Patient contacted office same day of appointment to reschedule due to relatives coming from out of town. Session is a no show

## 2020-12-10 ENCOUNTER — Telehealth (HOSPITAL_COMMUNITY): Payer: Medicare Other | Admitting: Psychiatry

## 2020-12-16 ENCOUNTER — Encounter (HOSPITAL_COMMUNITY): Payer: Self-pay | Admitting: Psychiatry

## 2020-12-16 ENCOUNTER — Telehealth (INDEPENDENT_AMBULATORY_CARE_PROVIDER_SITE_OTHER): Payer: Medicare Other | Admitting: Psychiatry

## 2020-12-16 DIAGNOSIS — F3130 Bipolar disorder, current episode depressed, mild or moderate severity, unspecified: Secondary | ICD-10-CM

## 2020-12-16 DIAGNOSIS — F5102 Adjustment insomnia: Secondary | ICD-10-CM | POA: Diagnosis not present

## 2020-12-16 DIAGNOSIS — F411 Generalized anxiety disorder: Secondary | ICD-10-CM

## 2020-12-16 MED ORDER — BENZTROPINE MESYLATE 0.5 MG PO TABS: 0.5000 mg | ORAL_TABLET | Freq: Two times a day (BID) | ORAL | 2 refills | Status: DC

## 2020-12-16 MED ORDER — BUSPIRONE HCL 7.5 MG PO TABS
7.5000 mg | ORAL_TABLET | Freq: Two times a day (BID) | ORAL | 1 refills | Status: DC
Start: 2020-12-16 — End: 2021-03-10

## 2020-12-16 NOTE — Progress Notes (Signed)
[facility] Outpatient Follow up visit   Patient Identification: Kevin Savage MRN:  [id number] Date of Evaluation:  12/16/2020 Referral Source: [place] system Chief Complaint:   bipolar follow up , early visit Visit Diagnosis:    ICD-10-CM   1. Bipolar I disorder, most recent episode depressed ([place])  F31.30   2. GAD (generalized anxiety disorder)  F41.1   3. Adjustment insomnia  F51.02        I connected with Reyden A Chasen on 12/16/20 at  1:30 PM EST by telephone and verified that I am speaking with the correct person using two identifiers.  I discussed the limitations, risks, security and privacy concerns of performing an evaluation and management service by telephone and the availability of in person appointments. I also discussed with the patient that there may be a patient responsible charge related to this service. The patient expressed understanding and agreed to proceed.  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   Was doing fair but gets subdued at times, some memory fog . He got confused if he was taking zoloft 450mg  and called .  Reviewed meds he is taking zoloft 250mg  as prescribed and was wellbutrin 450mg  which has been his regular dose Wife trying to keep him engaged Cogentin helps with tremors   Has been taking extra buspar and wants to take more for anxiety   not hopeless Lamictal, risperdal helps mood balance No rash   Aggravating factor: step daughter in past, weight concerns, medical co morbidity  Stressful military service Pituitary adenoma Modifying factor: family  Duration : 20 plus years Associated Signs/Symptoms: No psychosis or paranoia  Past  Psychiatric History: First hospitalization 1989 tried to hang himself while in [employer].  2004. Diagnosed with bipolar with psychotic features admitted in hospital 2014 manic episode.      Past Medical History:  Past Medical History:  Diagnosis Date  . Gout   . High blood pressure   . Pituitary adenoma ([place])    History reviewed. No pertinent surgical history.    Family History: History reviewed. No pertinent family history.  Social History:   Social History   Socioeconomic History  . Marital status: Single    Spouse name: Not on file  . Number of children: Not on file  . Years of education: Not on file  . Highest education level: Not on file  Occupational History  . Not on file  Tobacco Use  . Smoking status: Never Smoker  . Smokeless tobacco: Never Used  Vaping Use  . Vaping Use: Never used  Substance and Sexual Activity  . Alcohol use: No  . Drug use: No  . Sexual activity: Yes    Partners: Female  Other Topics Concern  . Not on file  Social History Narrative  . Not on file   Social Determinants of Health   Financial Resource Strain: Not on file  Food Insecurity: Not on file  Transportation Needs: Not on file  Physical Activity: Not on file  Stress: Not on file  Social Connections: Not on file     Allergies:  No Known Allergies  Metabolic Disorder Labs: No results found for: HGBA1C,  MPG No results found for: PROLACTIN No results found for: CHOL, TRIG, HDL, CHOLHDL, VLDL, LDLCALC   Current Medications: Current Outpatient Medications  Medication Sig Dispense Refill  . allopurinol (ZYLOPRIM) 300 MG tablet Take 300 mg by mouth.    Marland Kitchen aspirin 81 MG chewable tablet Chew 81 mg by mouth.    . benztropine (COGENTIN) 0.5 MG tablet Take 1 tablet (0.5 mg total) by mouth 2 (two) times daily. 60 tablet 2  . buPROPion (WELLBUTRIN XL) 150 MG 24 hr tablet Take 3 tablets (450 mg total) by mouth daily. 90 tablet 1  . busPIRone (BUSPAR) 7.5 MG tablet Take 1 tablet (7.5  mg total) by mouth 2 (two) times daily. 60 tablet 1  . cabergoline (DOSTINEX) 0.5 MG tablet Take 0.25 mg by mouth.    . carvedilol (COREG) 6.25 MG tablet Take 6.25 mg by mouth.    . Cyanocobalamin (RA VITAMIN B-12 TR) 1000 MCG TBCR Take by mouth.    . diclofenac (CATAFLAM) 50 MG tablet Take 50 mg by mouth.    . DOCOSAHEXAENOIC ACID PO Take 1 g by mouth.    . fluticasone (FLONASE) 50 MCG/ACT nasal spray Place into the nose.    . furosemide (LASIX) 20 MG tablet Take 20 mg by mouth.    . lamoTRIgine (LAMICTAL) 200 MG tablet Take 1 tablet (200 mg total) by mouth 2 (two) times daily. 60 tablet 1  . loratadine (CLARITIN) 10 MG tablet Take 10 mg by mouth.    . Misc Natural Products (DANDELION ROOT PO) Take by mouth.    . omega-3 acid ethyl esters (LOVAZA) 1 g capsule Take by mouth daily.    Marland Kitchen omeprazole (PRILOSEC) 20 MG capsule Take 20 mg by mouth.    . risperiDONE (RISPERDAL) 3 MG tablet Take 1 tablet (3 mg total) by mouth 2 (two) times daily. 60 tablet 1  . rosuvastatin (CRESTOR) 20 MG tablet Take 10 mg by mouth.    . sertraline (ZOLOFT) 100 MG tablet Take 2.5 tablets (250 mg total) by mouth daily. 75 tablet 2  . testosterone enanthate (DELATESTRYL) 200 MG/ML injection Inject into the muscle.    . valsartan (DIOVAN) 160 MG tablet Take 160 mg by mouth.     No current facility-administered medications for this visit.      Psychiatric Specialty Exam: Review of Systems  Cardiovascular: Negative for chest pain.  Skin: Negative for rash.  Psychiatric/Behavioral: Negative for substance abuse and suicidal ideas.    There were no vitals taken for this visit.There is no height or weight on file to calculate BMI.  General Appearance:  Eye Contact:    Speech:  Normal Rate  Volume:  Normal  Mood: somewhat subdued  Affect:  congruent  Thought Process:  Goal Directed  Orientation:  Full (Time, Place, and Person)  Thought Content:  Rumination  Suicidal Thoughts:  No  Homicidal Thoughts:  No   Memory:  Immediate;   Fair Recent;   Fair  Judgement:  Fair  Insight:  Shallow  Psychomotor Activity:  Normal  Concentration:  Concentration: Fair and Attention Span: Fair  Recall:  [employer] of Knowledge:Fair  Language: Fair  Akathisia:  Negative  Handed:  Right  AIMS (if indicated):    Assets:  Desire for Improvement  ADL's:  Intact  Cognition: WNL  Sleep:  Fair on meds    Treatment Plan Summary: Medication management and Plan as follows   Bipolar disorder : depression ;somewhat subdued, continue risperdal, lamictal he doesn't  want to lower any meds Feels comfortable with zoloft 250mg   Alcohol use : denies recent use GAD; gets anxious, continue zoloft, increase buspar to bid, consider therapy Engage in reading books and activities Insomnia: reviewed sleep hygiene . risperdal helping with sleep as well.  I discussed the assessment and treatment plan with the patient. The patient was provided an opportunity to ask questions and all were answered. The patient agreed with the plan and demonstrated an understanding of the instructions.   The patient was advised to call back or seek an in-person evaluation if the symptoms worsen or if the condition fails to improve as anticipated. Fu 2-10m or earlier if needed I provided 15 - 20 minutes of non-face-to-face time during this encounter.  Merian Capron, MD 1/11/20221:45 PM

## 2020-12-24 ENCOUNTER — Telehealth (HOSPITAL_COMMUNITY): Payer: Medicare Other | Admitting: Psychiatry

## 2020-12-29 ENCOUNTER — Other Ambulatory Visit (HOSPITAL_COMMUNITY): Payer: Self-pay | Admitting: Psychiatry

## 2020-12-29 ENCOUNTER — Ambulatory Visit (INDEPENDENT_AMBULATORY_CARE_PROVIDER_SITE_OTHER): Payer: Medicare Other | Admitting: Licensed Clinical Social Worker

## 2020-12-29 DIAGNOSIS — F411 Generalized anxiety disorder: Secondary | ICD-10-CM | POA: Diagnosis not present

## 2020-12-29 DIAGNOSIS — F5102 Adjustment insomnia: Secondary | ICD-10-CM

## 2020-12-29 DIAGNOSIS — F3130 Bipolar disorder, current episode depressed, mild or moderate severity, unspecified: Secondary | ICD-10-CM | POA: Diagnosis not present

## 2020-12-29 NOTE — Progress Notes (Signed)
Virtual Visit via Telephone Note  I connected with Merik A Tangeman on 12/29/20 at  1:00 PM EST by telephone and verified that I am speaking with the correct person using two identifiers.  Location: Patient: home Provider: home office   I discussed the limitations, risks, security and privacy concerns of performing an evaluation and management service by telephone and the availability of in person appointments. I also discussed with the patient that there may be a patient responsible charge related to this service. The patient expressed understanding and agreed to proceed.  I discussed the assessment and treatment plan with the patient. The patient was provided an opportunity to ask questions and all were answered. The patient agreed with the plan and demonstrated an understanding of the instructions.   The patient was advised to call back or seek an in-person evaluation if the symptoms worsen or if the condition fails to improve as anticipated.  I provided 53 minutes of non-face-to-face time during this encounter.  THERAPIST PROGRESS NOTE  Session Time: 1:00 PM to 1:53 PM  Participation Level: Active  Behavioral Response: CasualAlertAnxious  Type of Therapy: Individual Therapy  Treatment Goals addressed:  Decrease anxiety and depression, strengthen self-esteem, decrease trauma symptoms Interventions: CBT, Solution Focused, Strength-based, Supportive, Reframing and Other: trauma  Summary: Coyle A Territo is a 55 y.o. male who presents with having tough time today. "Feeling out of sorts." Trying to stay busy, sweat the floor, shaky, took anxiety pill and hope that helps. "Can't tune to a radio station." Not a good weekend carry over that in thoughts and being in his thoughts can make it worse. Shared after therapy appointments sometimes feel good sometimes dredge up things and when hang up things comes up for him. Nurse is now coming in Tuesday now. Confused about medicine and she is working  with him. Memory issues tough times remembering things. [facility]. Third week and helps some. Things seem to quiet his head talking to this therapist. Animals are therapy for him. Sammy, Troop, Chihuahua-Angel, Three horses, had Max-dog who passed, Cindy's horse is Sugar, 3 cats 1 at house and two at the barn.  Patient shared about some of his doubts and concerned and therapist worked with him on challenging and accurate thoughts and unhelpful thoughts and replacing them with ones that are more accurate and helpful.  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 Ttherapist related to her impression that he is putting more on himself than was humanly possible, more on his shoulders that he deserved.  Also recognizing what tremendous work he did and these very scary situations. Patient shared about being triggered even when he tries to put it out of his mind, when going to [place] smells can bring up trauma. Therapist discussed grounding as a way to help him with trauma triggers, ways to help remind him he is in the present, even verbally reminding himself where he is to help ground him to the present.  Therapist guided patient in understanding sometimes when we push the thoughts away it keeps the trauma around longer so sessions could be a good place for him to talk about it, does not share these difficult memories with family only lighter moments.  Patient shared was feeling better after talking to therapist and therapist related that language gives [place] a way to contain our feelings so that we can manage them better. It is also a way that  we give meaning to experience in this meeting also helps [place] in coping.  Therapist utilized strength-based interventions, supportive utilized active listening and open questions Suicidal/Homicidal: No  Plan: Return again in 5 weeks.2.Therapist work with patient on mood, anxiety, self-esteem  Diagnosis: Axis  I:  ipolar I disorder, most recent episode depressed, Generalized Anxiety disorder, Adjustment Insomnia     Axis II: No diagnosis    Cordella Register, LCSW 12/29/2020

## 2020-12-31 ENCOUNTER — Other Ambulatory Visit (HOSPITAL_COMMUNITY): Payer: Self-pay | Admitting: Psychiatry

## 2021-02-02 ENCOUNTER — Ambulatory Visit (HOSPITAL_COMMUNITY): Payer: Medicare Other | Admitting: Licensed Clinical Social Worker

## 2021-02-02 NOTE — Progress Notes (Signed)
Patient canceled appointment day of session. Session is a no show

## 2021-02-03 ENCOUNTER — Other Ambulatory Visit (HOSPITAL_COMMUNITY): Payer: Self-pay | Admitting: Psychiatry

## 2021-02-23 ENCOUNTER — Ambulatory Visit (HOSPITAL_COMMUNITY): Payer: Medicare Other | Admitting: Licensed Clinical Social Worker

## 2021-02-23 ENCOUNTER — Encounter (HOSPITAL_COMMUNITY): Payer: Self-pay | Admitting: Licensed Clinical Social Worker

## 2021-02-23 NOTE — Progress Notes (Signed)
  Therapist called patient by phone and left a message on voicemail for session. Session is a no show

## 2021-03-06 ENCOUNTER — Other Ambulatory Visit (HOSPITAL_COMMUNITY): Payer: Self-pay | Admitting: Psychiatry

## 2021-03-10 ENCOUNTER — Other Ambulatory Visit (HOSPITAL_COMMUNITY): Payer: Self-pay

## 2021-03-10 MED ORDER — BUSPIRONE HCL 7.5 MG PO TABS: 7.5000 mg | ORAL_TABLET | Freq: Two times a day (BID) | ORAL | 1 refills | Status: AC

## 2021-03-10 MED ORDER — LAMOTRIGINE 200 MG PO TABS: 200.0000 mg | ORAL_TABLET | Freq: Two times a day (BID) | ORAL | 1 refills | Status: AC

## 2021-03-10 MED ORDER — LAMOTRIGINE 200 MG PO TABS
200.0000 mg | ORAL_TABLET | Freq: Two times a day (BID) | ORAL | 0 refills | Status: DC
Start: 2021-03-10 — End: 2021-03-10

## 2021-03-10 MED ORDER — SERTRALINE HCL 100 MG PO TABS: 250.0000 mg | ORAL_TABLET | Freq: Every day | ORAL | 2 refills | Status: AC

## 2021-03-10 MED ORDER — BENZTROPINE MESYLATE 0.5 MG PO TABS: 0.5000 mg | ORAL_TABLET | Freq: Two times a day (BID) | ORAL | 2 refills | Status: AC

## 2021-03-10 MED ORDER — BUPROPION HCL ER (XL) 150 MG PO TB24: 450.0000 mg | ORAL_TABLET | Freq: Every day | ORAL | 1 refills | Status: AC

## 2021-03-10 MED ORDER — RISPERIDONE 3 MG PO TABS: 3.0000 mg | ORAL_TABLET | Freq: Two times a day (BID) | ORAL | 1 refills | Status: AC

## 2021-03-16 ENCOUNTER — Ambulatory Visit (HOSPITAL_COMMUNITY): Payer: Medicare Other | Admitting: Licensed Clinical Social Worker

## 2021-03-17 ENCOUNTER — Telehealth (INDEPENDENT_AMBULATORY_CARE_PROVIDER_SITE_OTHER): Payer: Medicare Other | Admitting: Psychiatry

## 2021-03-17 ENCOUNTER — Encounter (HOSPITAL_COMMUNITY): Payer: Self-pay | Admitting: Psychiatry

## 2021-03-17 DIAGNOSIS — F3130 Bipolar disorder, current episode depressed, mild or moderate severity, unspecified: Secondary | ICD-10-CM

## 2021-03-17 DIAGNOSIS — F411 Generalized anxiety disorder: Secondary | ICD-10-CM

## 2021-03-17 NOTE — Progress Notes (Signed)
[facility] Outpatient Follow up visit   Patient Identification: Kevin Savage MRN:  [id number] Date of Evaluation:  03/17/2021 Referral Source: [place] system Chief Complaint:   bipolar follow up , early visit Visit Diagnosis:    ICD-10-CM   1. Bipolar I disorder, most recent episode depressed ([place])  F31.30   2. GAD (generalized anxiety disorder)  F41.1    Virtual Visit via Telephone Note  I connected with Kevin Savage on 03/17/21 at  1:30 PM EDT by telephone and verified that I am speaking with the correct person using two identifiers.  Location: Patient: home Provider: office   I discussed the limitations, risks, security and privacy concerns of performing an evaluation and management service by telephone and the availability of in person appointments. I also discussed with the patient that there may be a patient responsible charge related to this service. The patient expressed understanding and agreed to proceed.      I discussed the assessment and treatment plan with the patient. The patient was provided an opportunity to ask questions and all were answered. The patient agreed with the plan and demonstrated an understanding of the instructions.   The patient was advised to call back or seek an in-person evaluation if the symptoms worsen or if the condition fails to improve as anticipated.  I provided 12 minutes of non-face-to-face time during this encounter. Including documentation     History of Present Illness:  55   years old currently married Caucasian male initially  referred by the [place] system for management of mood symptoms he carries a diagnosis of bipolar disorder and also is on disability   Remains baseline, getting MRI done as complained about forgetfulllness Discussed to engage in activities Gets worriful , could not hold a job and can compromise his mental health Daughter now a Marine scientist , he feels proud of her   Aggravating factor: step daughter in past, weight concerns,  medical co morbidity  Stressful military service Pituitary adenoma Modifying factor family  Duration : 20 plus years Associated Signs/Symptoms: No psychosis or paranoia  Past Psychiatric History: First hospitalization 1989 tried to hang himself while in [employer].  2004. Diagnosed with bipolar with psychotic features admitted in hospital 2014 manic episode.      Past Medical History:  Past Medical History:  Diagnosis Date  . Gout   . High blood pressure   . Pituitary adenoma ([place])    No past surgical history on file.    Family History: No family history on file.  Social History:   Social History   Socioeconomic History  . Marital status: Single    Spouse name: Not on file  . Number of children: Not on file  . Years of education: Not on file  . Highest education level: Not on file  Occupational History  . Not on file  Tobacco Use  . Smoking status: Never Smoker  . Smokeless tobacco: Never Used  Vaping Use  . Vaping Use: Never used  Substance and Sexual Activity  . Alcohol use: No  . Drug use: No  . Sexual activity: Yes    Partners: Female  Other Topics Concern  . Not on file  Social History Narrative  . Not on file   Social Determinants of Health   Financial Resource Strain: Not on file  Food Insecurity: Not on file  Transportation Needs: Not on file  Physical Activity: Not on file  Stress: Not on file  Social Connections: Not on file  Allergies:  No Known Allergies  Metabolic Disorder Labs: No results found for: HGBA1C, MPG No results found for: PROLACTIN No results found for: CHOL, TRIG, HDL, CHOLHDL, VLDL, LDLCALC   Current Medications: Current Outpatient Medications  Medication Sig Dispense Refill  . allopurinol (ZYLOPRIM) 300 MG tablet Take 300 mg by mouth.    Marland Kitchen aspirin 81 MG chewable tablet Chew 81 mg by mouth.    . benztropine (COGENTIN) 0.5 MG tablet Take 1 tablet (0.5 mg total) by mouth 2 (two) times daily. 60 tablet 2  . buPROPion  (WELLBUTRIN XL) 150 MG 24 hr tablet Take 3 tablets (450 mg total) by mouth daily. 90 tablet 1  . busPIRone (BUSPAR) 7.5 MG tablet Take 1 tablet (7.5 mg total) by mouth 2 (two) times daily. 60 tablet 1  . cabergoline (DOSTINEX) 0.5 MG tablet Take 0.25 mg by mouth.    . carvedilol (COREG) 6.25 MG tablet Take 6.25 mg by mouth.    . Cyanocobalamin (RA VITAMIN B-12 TR) 1000 MCG TBCR Take by mouth.    . diclofenac (CATAFLAM) 50 MG tablet Take 50 mg by mouth.    . DOCOSAHEXAENOIC ACID PO Take 1 g by mouth.    . fluticasone (FLONASE) 50 MCG/ACT nasal spray Place into the nose.    . furosemide (LASIX) 20 MG tablet Take 20 mg by mouth.    . lamoTRIgine (LAMICTAL) 200 MG tablet Take 1 tablet (200 mg total) by mouth 2 (two) times daily. 60 tablet 1  . loratadine (CLARITIN) 10 MG tablet Take 10 mg by mouth.    . Misc Natural Products (DANDELION ROOT PO) Take by mouth.    . omega-3 acid ethyl esters (LOVAZA) 1 g capsule Take by mouth daily.    Marland Kitchen omeprazole (PRILOSEC) 20 MG capsule Take 20 mg by mouth.    . risperiDONE (RISPERDAL) 3 MG tablet Take 1 tablet (3 mg total) by mouth 2 (two) times daily. 60 tablet 1  . rosuvastatin (CRESTOR) 20 MG tablet Take 10 mg by mouth.    . sertraline (ZOLOFT) 100 MG tablet Take 2.5 tablets (250 mg total) by mouth daily. 75 tablet 2  . testosterone enanthate (DELATESTRYL) 200 MG/ML injection Inject into the muscle.    . valsartan (DIOVAN) 160 MG tablet Take 160 mg by mouth.     No current facility-administered medications for this visit.      Psychiatric Specialty Exam: Review of Systems  Cardiovascular: Negative for chest pain.  Skin: Negative for rash.  Psychiatric/Behavioral: Negative for substance abuse and suicidal ideas.    There were no vitals taken for this visit.There is no height or weight on file to calculate BMI.  General Appearance:  Eye Contact:    Speech:  Normal Rate  Volume:  Normal  Mood: somewhat subdued  Affect:  congruent  Thought Process:   Goal Directed  Orientation:  Full (Time, Place, and Person)  Thought Content:  Rumination  Suicidal Thoughts:  No  Homicidal Thoughts:  No  Memory:  Immediate;   Fair Recent;   Fair  Judgement:  Fair  Insight:  Shallow  Psychomotor Activity:  Normal  Concentration:  Concentration: Fair and Attention Span: Fair  Recall:  [employer] of Knowledge:Fair  Language: Fair  Akathisia:  Negative  Handed:  Right  AIMS (if indicated):    Assets:  Desire for Improvement  ADL's:  Intact  Cognition: WNL  Sleep:  Fair on meds    Treatment Plan Summary: Medication management and Plan as  follows   Bipolar disorder : depression ; baseline, continue meds including lamictal Alcohol use : denies recent use GAD;  Work on Chemical engineer , continue zoloft   Engage in reading books and activities Reviewed meds and has refills for now Fu 18m  Merian Capron, MD 4/12/20221:40 PM

## 2021-06-24 ENCOUNTER — Telehealth (HOSPITAL_COMMUNITY): Payer: Medicare Other | Admitting: Psychiatry
